# Patient Record
Sex: Female | Born: 1990 | Race: White | Hispanic: No | Marital: Single | Smoking: Former smoker
Health system: Southern US, Community
[De-identification: ages and names within clinical notes are randomized; demographics above are authoritative.]

## PROBLEM LIST (undated history)

## (undated) ENCOUNTER — Inpatient Hospital Stay (HOSPITAL_COMMUNITY): Payer: Self-pay

## (undated) ENCOUNTER — Inpatient Hospital Stay: Payer: Self-pay

## (undated) DIAGNOSIS — K219 Gastro-esophageal reflux disease without esophagitis: Secondary | ICD-10-CM

## (undated) HISTORY — PX: NO PAST SURGERIES: SHX2092

---

## 2012-12-10 ENCOUNTER — Ambulatory Visit: Payer: Self-pay | Admitting: Internal Medicine

## 2012-12-10 LAB — URINALYSIS, COMPLETE
Bilirubin,UR: NEGATIVE
Blood: NEGATIVE
Glucose,UR: NEGATIVE mg/dL (ref 0–75)
Ketone: NEGATIVE
Leukocyte Esterase: NEGATIVE
Nitrite: NEGATIVE
Ph: 5 (ref 4.5–8.0)
Protein: NEGATIVE
Specific Gravity: 1.02 (ref 1.003–1.030)

## 2012-12-10 LAB — PREGNANCY, URINE: Pregnancy Test, Urine: NEGATIVE m[IU]/mL

## 2012-12-11 LAB — URINE CULTURE

## 2013-01-25 ENCOUNTER — Encounter (HOSPITAL_COMMUNITY): Payer: Self-pay | Admitting: Emergency Medicine

## 2013-01-25 ENCOUNTER — Emergency Department (HOSPITAL_COMMUNITY)
Admission: EM | Admit: 2013-01-25 | Discharge: 2013-01-25 | Disposition: A | Discharge status: Home / Self Care | Payer: Self-pay | Attending: Emergency Medicine | Admitting: Emergency Medicine

## 2013-01-25 DIAGNOSIS — L0501 Pilonidal cyst with abscess: Secondary | ICD-10-CM | POA: Insufficient documentation

## 2013-01-25 DIAGNOSIS — L0591 Pilonidal cyst without abscess: Secondary | ICD-10-CM

## 2013-01-25 MED ORDER — SULFAMETHOXAZOLE-TRIMETHOPRIM 800-160 MG PO TABS: 1.0000 | ORAL_TABLET | Freq: Two times a day (BID) | ORAL | Status: DC

## 2013-01-25 MED ORDER — HYDROCODONE-ACETAMINOPHEN 5-325 MG PO TABS
1.0000 | ORAL_TABLET | Freq: Four times a day (QID) | ORAL | Status: DC | PRN
Start: 2013-01-25 — End: 2015-10-30

## 2013-01-25 MED ORDER — OXYCODONE-ACETAMINOPHEN 5-325 MG PO TABS
2.0000 | ORAL_TABLET | Freq: Once | ORAL | Status: AC
  Administered 2013-01-25: 2 via ORAL
  Filled 2013-01-25: qty 2

## 2013-01-25 NOTE — ED Provider Notes (Signed)
CSN: [id number]     Arrival date & time 01/25/13  4098 History  This chart was scribed for non-physician practitioner working Magnus Sinning, [place], with Glynn Octave, MD by Dorothey Baseman, ED Scribe. This patient was seen in room TR11C/TR11C and the patient's care was started at 10:10 PM.   Chief Complaint  Patient presents with  . Abscess   The history is provided by the patient. No language interpreter was used.   HPI Comments: Cassandra Erickson is a 22 y.o. female who presents to the Emergency Department complaining of a painful abscess on the upper buttock for the past few days without drainage. She reports that the pain is worsening.  No treatment prior to arrival.  She denies nausea, vomiting, fever, chills, or any other symptoms. Patient denies any prior history of abscesses or DM.  History reviewed. No pertinent past medical history. History reviewed. No pertinent past surgical history. No family history on file. History  Substance Use Topics  . Smoking status: Never Smoker   . Smokeless tobacco: Not on file  . Alcohol Use: No   OB History   Grav Para Term Preterm Abortions TAB SAB Ect Mult Living                 Review of Systems  A complete 10 system review of systems was obtained and all systems are negative except as noted in the HPI and PMH.   Allergies  Review of patient's allergies indicates no known allergies.  Home Medications  No current outpatient prescriptions on file.  Triage Vitals: BP 111/74  Pulse 91  Temp(Src) 98 F (36.7 C) (Oral)  Resp 14  SpO2 97%  LMP 01/20/2013  Physical Exam  Nursing note and vitals reviewed. Constitutional: She is oriented to person, place, and time. She appears well-developed and well-nourished. No distress.  HENT:  Head: Normocephalic and atraumatic.  Eyes: Conjunctivae are normal.  Neck: Normal range of motion. Neck supple.  Cardiovascular: Normal rate, regular rhythm and normal heart sounds.   Pulmonary/Chest:  Effort normal and breath sounds normal. No respiratory distress.  Musculoskeletal: Normal range of motion.  Neurological: She is alert and oriented to person, place, and time.  Skin: Skin is warm and dry.  3cm erythematous indurated area to the gluteal cleft consistent with a pilonidal cyst that is tender to palpation.  No active drainage at this time.  Psychiatric: She has a normal mood and affect. Her behavior is normal.    ED Course  Procedures (including critical care time)  Medications  oxyCODONE-acetaminophen (PERCOCET/ROXICET) 5-325 MG per tablet 2 tablet (2 tablets Oral Given 01/25/13 2302)    DIAGNOSTIC STUDIES: Oxygen Saturation is 97% on room air, normal by my interpretation.    COORDINATION OF CARE: 10:11PM- Discussed diagnosis of pilonidal cyst. Will refer patient to a general surgeon. Will perform an incision and drainage of the cyst. Discussed treatment plan with patient at bedside and patient verbalized agreement.    INCISION AND DRAINAGE Performed by: Concha Se, PA-S with supervision of Magnus Sinning, PA-C Consent: Verbal consent obtained. Risks and benefits: risks, benefits and alternatives were discussed Type: abscess Body area: sacrum Anesthesia: local infiltration Incision was made with a scalpel. Local anesthetic: lidocaine 2% with epinephrine Anesthetic total: 5 ml Complexity: simple Blunt dissection to break up loculations Drainage: purulent Drainage amount: moderate Patient tolerance: Patient tolerated the procedure well with no immediate complications.     Labs Review Labs Reviewed - No data to display Imaging Review  No results found.  MDM  No diagnosis found. Patient presenting with a pilonidal cyst.  Area incised and drained.  Patient instructed to apply warm compresses to the area.  Patient given referral to General Surgery.  I personally performed the services described in this documentation, which was scribed in my presence.  The recorded information has been reviewed and is accurate.    Pascal Lux Brewster, PA-C 01/31/13 [phone]

## 2013-01-25 NOTE — ED Notes (Signed)
Pt. reports abscess at buttocks for several days with no drainage.

## 2013-01-31 NOTE — ED Provider Notes (Signed)
Medical screening examination/treatment/procedure(s) were performed by non-physician practitioner and as supervising physician I was immediately available for consultation/collaboration.   Glynn Octave, MD 01/31/13 [phone]

## 2014-04-30 ENCOUNTER — Emergency Department: Payer: Self-pay | Admitting: Emergency Medicine

## 2014-04-30 LAB — CBC WITH DIFFERENTIAL/PLATELET
Basophil #: 0 10*3/uL (ref 0.0–0.1)
Basophil %: 0.2 %
Eosinophil #: 0 10*3/uL (ref 0.0–0.7)
Eosinophil %: 0.4 %
HCT: 42.2 % (ref 35.0–47.0)
HGB: 13.9 g/dL (ref 12.0–16.0)
Lymphocyte #: 1.3 10*3/uL (ref 1.0–3.6)
Lymphocyte %: 19.8 %
MCH: 29.2 pg (ref 26.0–34.0)
MCHC: 33 g/dL (ref 32.0–36.0)
MCV: 88 fL (ref 80–100)
Monocyte #: 0.5 x10 3/mm (ref 0.2–0.9)
Monocyte %: 7.7 %
Neutrophil #: 4.9 10*3/uL (ref 1.4–6.5)
Neutrophil %: 71.9 %
Platelet: 208 10*3/uL (ref 150–440)
RBC: 4.77 10*6/uL (ref 3.80–5.20)
RDW: 13 % (ref 11.5–14.5)
WBC: 6.8 10*3/uL (ref 3.6–11.0)

## 2014-04-30 LAB — URINALYSIS, COMPLETE
Bilirubin,UR: NEGATIVE
Blood: NEGATIVE
Glucose,UR: NEGATIVE mg/dL (ref 0–75)
Ketone: NEGATIVE
Nitrite: NEGATIVE
Ph: 5 (ref 4.5–8.0)
Protein: 30
RBC,UR: 17 /HPF (ref 0–5)
Specific Gravity: 1.035 (ref 1.003–1.030)
Squamous Epithelial: 5
WBC UR: 6 /HPF (ref 0–5)

## 2014-04-30 LAB — COMPREHENSIVE METABOLIC PANEL
Albumin: 4 g/dL (ref 3.4–5.0)
Alkaline Phosphatase: 83 U/L
Anion Gap: 7 (ref 7–16)
BUN: 10 mg/dL (ref 7–18)
Bilirubin,Total: 0.3 mg/dL (ref 0.2–1.0)
Calcium, Total: 8.6 mg/dL (ref 8.5–10.1)
Chloride: 108 mmol/L — ABNORMAL HIGH (ref 98–107)
Co2: 24 mmol/L (ref 21–32)
Creatinine: 0.77 mg/dL (ref 0.60–1.30)
EGFR (African American): >60
EGFR (Non-African Amer.): >60
Glucose: 92 mg/dL (ref 65–99)
Osmolality: 276 (ref 275–301)
Potassium: 3.4 mmol/L — ABNORMAL LOW (ref 3.5–5.1)
SGOT(AST): 11 U/L — ABNORMAL LOW (ref 15–37)
SGPT (ALT): 20 U/L
Sodium: 139 mmol/L (ref 136–145)
Total Protein: 7.6 g/dL (ref 6.4–8.2)

## 2014-04-30 LAB — LIPASE, BLOOD: Lipase: 71 U/L — ABNORMAL LOW (ref 73–393)

## 2015-10-30 ENCOUNTER — Emergency Department
Admission: EM | Admit: 2015-10-30 | Discharge: 2015-10-30 | Disposition: A | Discharge status: Home / Self Care | Payer: Managed Care, Other (non HMO) | Attending: Student | Admitting: Student

## 2015-10-30 ENCOUNTER — Encounter: Payer: Self-pay | Admitting: Emergency Medicine

## 2015-10-30 ENCOUNTER — Emergency Department: Payer: Managed Care, Other (non HMO)

## 2015-10-30 DIAGNOSIS — Z3A01 Less than 8 weeks gestation of pregnancy: Secondary | ICD-10-CM | POA: Diagnosis not present

## 2015-10-30 DIAGNOSIS — R1010 Upper abdominal pain, unspecified: Secondary | ICD-10-CM | POA: Diagnosis present

## 2015-10-30 DIAGNOSIS — R109 Unspecified abdominal pain: Secondary | ICD-10-CM

## 2015-10-30 DIAGNOSIS — O26899 Other specified pregnancy related conditions, unspecified trimester: Secondary | ICD-10-CM

## 2015-10-30 DIAGNOSIS — O200 Threatened abortion: Secondary | ICD-10-CM | POA: Diagnosis not present

## 2015-10-30 LAB — URINALYSIS COMPLETE WITH MICROSCOPIC (ARMC ONLY)
Bilirubin Urine: NEGATIVE
Glucose, UA: NEGATIVE mg/dL
Hgb urine dipstick: NEGATIVE
Ketones, ur: NEGATIVE mg/dL
Leukocytes, UA: NEGATIVE
Nitrite: NEGATIVE
Protein, ur: NEGATIVE mg/dL
Specific Gravity, Urine: 1.003 — ABNORMAL LOW (ref 1.005–1.030)
pH: 7 (ref 5.0–8.0)

## 2015-10-30 LAB — COMPREHENSIVE METABOLIC PANEL
ALT: 20 U/L (ref 14–54)
AST: 18 U/L (ref 15–41)
Albumin: 4.1 g/dL (ref 3.5–5.0)
Alkaline Phosphatase: 61 U/L (ref 38–126)
Anion gap: 8 (ref 5–15)
BUN: 14 mg/dL (ref 6–20)
CO2: 23 mmol/L (ref 22–32)
Calcium: 9.1 mg/dL (ref 8.9–10.3)
Chloride: 107 mmol/L (ref 101–111)
Creatinine, Ser: 0.52 mg/dL (ref 0.44–1.00)
GFR calc Af Amer: >60 mL/min (ref >60)
GFR calc non Af Amer: >60 mL/min (ref >60)
Glucose, Bld: 96 mg/dL (ref 65–99)
Potassium: 3.8 mmol/L (ref 3.5–5.1)
Sodium: 138 mmol/L (ref 135–145)
Total Bilirubin: 0.4 mg/dL (ref 0.3–1.2)
Total Protein: 7.1 g/dL (ref 6.5–8.1)

## 2015-10-30 LAB — CBC
HCT: 37.7 % (ref 35.0–47.0)
Hemoglobin: 12.6 g/dL (ref 12.0–16.0)
MCH: 29.6 pg (ref 26.0–34.0)
MCHC: 33.5 g/dL (ref 32.0–36.0)
MCV: 88.4 fL (ref 80.0–100.0)
Platelets: 233 10*3/uL (ref 150–440)
RBC: 4.27 MIL/uL (ref 3.80–5.20)
RDW: 13.4 % (ref 11.5–14.5)
WBC: 8.4 10*3/uL (ref 3.6–11.0)

## 2015-10-30 LAB — PREGNANCY, URINE: Preg Test, Ur: POSITIVE — AB

## 2015-10-30 LAB — LIPASE, BLOOD: Lipase: 20 U/L (ref 11–51)

## 2015-10-30 LAB — HCG, QUANTITATIVE, PREGNANCY: hCG, Beta Chain, Quant, S: 10151 m[IU]/mL — ABNORMAL HIGH (ref <5)

## 2015-10-30 NOTE — ED Provider Notes (Signed)
[facility] Emergency Department Provider Note   ____________________________________________  Time seen: Approximately 5:26 PM  I have reviewed the triage vital signs and the nursing notes.   HISTORY  Chief Complaint Abdominal Pain    HPI Cassandra Erickson is a 25 y.o. female with no chronic medical problems, G1 P0 at approximately 5 weeks estimated gestational age by last menstrual period on 09/24/15 who presents for evaluation of one week of intermittent upper abdominal pain, usually gradual onset, happens mostly in the mornings and resolves spontaneously, not associated with eating, currently mild. No nausea, vomiting, diarrhea, fevers or chills. No chest pain or difficulty breathing. No abnormal vaginal bleeding or vaginal discharge.   History reviewed. No pertinent past medical history.  There are no active problems to display for this patient.   History reviewed. No pertinent past surgical history.  No current outpatient prescriptions on file.  Allergies Review of patient's allergies indicates no known allergies.  No family history on file.  Social History Social History  Substance Use Topics  . Smoking status: Never Smoker   . Smokeless tobacco: None  . Alcohol Use: No    Review of Systems Constitutional: No fever/chills Eyes: No visual changes. ENT: No sore throat. Cardiovascular: Denies chest pain. Respiratory: Denies shortness of breath. Gastrointestinal: + abdominal pain.  No nausea, no vomiting.  No diarrhea.  No constipation. Genitourinary: Negative for dysuria. Musculoskeletal: Negative for back pain. Skin: Negative for rash. Neurological: Negative for headaches, focal weakness or numbness.  10-point ROS otherwise negative.  ____________________________________________   PHYSICAL EXAM:  VITAL SIGNS: ED Triage Vitals  Enc Vitals Group     BP 10/30/15 1532 132/74 mmHg     Pulse Rate 10/30/15 1532 88     Resp  10/30/15 1532 18     Temp 10/30/15 1532 98.1 F (36.7 C)     Temp Source 10/30/15 1532 Oral     SpO2 10/30/15 1532 100 %     Weight 10/30/15 1532 159 lb (72.122 kg)     Height 10/30/15 1532 5' (1.524 m)     Head Cir --      Peak Flow --      Pain Score 10/30/15 1536 8     Pain Loc --      Pain Edu? --      Excl. in GC? --     Constitutional: Alert and oriented. Well appearing and in no acute distress. Eyes: Conjunctivae are normal. PERRL. EOMI. Head: Atraumatic. Nose: No congestion/rhinnorhea. Mouth/Throat: Mucous membranes are moist.  Oropharynx non-erythematous. Neck: No stridor. Supple without meningismus. Cardiovascular: Normal rate, regular rhythm. Grossly normal heart sounds.  Good peripheral circulation. Respiratory: Normal respiratory effort.  No retractions. Lungs CTAB. Gastrointestinal: Soft and nontender. No distention. No CVA tenderness. Genitourinary: deferred Musculoskeletal: No lower extremity tenderness nor edema.  No joint effusions. Neurologic:  Normal speech and language. No gross focal neurologic deficits are appreciated. No gait instability. Skin:  Skin is warm, dry and intact. No rash noted. Psychiatric: Mood and affect are normal. Speech and behavior are normal.  ____________________________________________   LABS (all labs ordered are listed, but only abnormal results are displayed)  Labs Reviewed  URINALYSIS COMPLETEWITH MICROSCOPIC ([facility] ONLY) - Abnormal; Notable for the following:    Color, Urine COLORLESS (*)    APPearance CLEAR (*)    Specific Gravity, Urine 1.003 (*)    Bacteria, UA RARE (*)    Squamous Epithelial / LPF 0-5 (*)    All  other components within normal limits  HCG, QUANTITATIVE, PREGNANCY - Abnormal; Notable for the following:    hCG, Beta Chain, Quant, S 10151 (*)    All other components within normal limits  PREGNANCY, URINE - Abnormal; Notable for the following:    Preg Test, Ur POSITIVE (*)    All other components within  normal limits  COMPREHENSIVE METABOLIC PANEL  CBC  LIPASE, BLOOD   ____________________________________________  EKG  none ____________________________________________  RADIOLOGY  Transvaginal ultrasound IMPRESSION: Single live intrauterine pregnancy noted, with a crown-rump length of 3 mm, corresponding to a gestational age of [redacted] weeks 6 days. This matches the gestational age of [redacted] weeks 1 day by LMP, reflecting an estimated date of delivery of June 30, 2016.  ____________________________________________   PROCEDURES  Procedure(s) performed: None  Critical Care performed: No  ____________________________________________   INITIAL IMPRESSION / ASSESSMENT AND PLAN / ED COURSE  Pertinent labs & imaging results that were available during my care of the patient were reviewed by me and considered in my medical decision making (see chart for details).  Cassandra Erickson is a 25 y.o. female with no chronic medical problems, G1 P0 at approximately 5 weeks estimated gestational age by last menstrual period on 09/24/15 who presents for evaluation of one week of intermittent upper abdominal pain which is usually self-limiting. Currently she does not have any pain. On exam, she is very well-appearing and in no acute distress, vital signs stable, she is afebrile. She has a benign abdominal examination. Low suspicion for any acute life that he injured abdominal process in this patient however given pregnancy, we'll obtain trans vaginal ultrasound to rule out ectopic.  ----------------------------------------- 7:14 PM on 10/30/2015 ----------------------------------------- I reviewed the patient's labs. CBC, CMP, lipase unremarkable. Urinalysis is not consistent with infection. HCG is appropriately elevated, transvaginal ultrasound shows a single live intrauterine pregnancy. Patient  has had no pain since arrival to the emergency department. I discussed that the cause of her pain is not  clear at this time, we discussed possible threatened miscarriage, need for close OB/GYN follow-up, recurrent precautions and she is comfortable with the discharge plan. DC home.  ____________________________________________   FINAL CLINICAL IMPRESSION(S) / ED DIAGNOSES  Final diagnoses:  Abdominal pain in pregnancy  Threatened miscarriage in early pregnancy      NEW MEDICATIONS STARTED DURING THIS VISIT:  New Prescriptions   No medications on file     Note:  This document was prepared using Dragon voice recognition software and may include unintentional dictation errors.    Gayla Doss, MD 10/30/15 [phone]

## 2015-10-30 NOTE — ED Notes (Signed)
Pt to US at this time.

## 2015-10-30 NOTE — ED Notes (Signed)
Family in the room - I informed them that the pt is in US and will return in approx 30 minutes

## 2015-10-30 NOTE — ED Notes (Signed)
Pt presents with abd pain, states she is five weeks pregnant. Denies any vaginal bleeding at this time.

## 2015-12-05 LAB — OB RESULTS CONSOLE RUBELLA ANTIBODY, IGM: Rubella: IMMUNE

## 2015-12-05 LAB — OB RESULTS CONSOLE HEPATITIS B SURFACE ANTIGEN: Hepatitis B Surface Ag: NEGATIVE

## 2015-12-05 LAB — OB RESULTS CONSOLE VARICELLA ZOSTER ANTIBODY, IGG: Varicella: IMMUNE

## 2015-12-05 LAB — OB RESULTS CONSOLE RPR: RPR: NONREACTIVE

## 2015-12-05 LAB — OB RESULTS CONSOLE ANTIBODY SCREEN: Antibody Screen: NEGATIVE

## 2015-12-05 LAB — OB RESULTS CONSOLE HIV ANTIBODY (ROUTINE TESTING): HIV: NONREACTIVE

## 2015-12-15 ENCOUNTER — Emergency Department: Payer: Managed Care, Other (non HMO)

## 2015-12-15 ENCOUNTER — Emergency Department
Admission: EM | Admit: 2015-12-15 | Discharge: 2015-12-15 | Disposition: A | Discharge status: Home / Self Care | Payer: Managed Care, Other (non HMO) | Attending: Emergency Medicine | Admitting: Emergency Medicine

## 2015-12-15 ENCOUNTER — Encounter: Payer: Self-pay | Admitting: Emergency Medicine

## 2015-12-15 DIAGNOSIS — Z79899 Other long term (current) drug therapy: Secondary | ICD-10-CM | POA: Insufficient documentation

## 2015-12-15 DIAGNOSIS — R102 Pelvic and perineal pain: Secondary | ICD-10-CM | POA: Diagnosis not present

## 2015-12-15 DIAGNOSIS — O209 Hemorrhage in early pregnancy, unspecified: Secondary | ICD-10-CM | POA: Diagnosis not present

## 2015-12-15 DIAGNOSIS — Z3A12 12 weeks gestation of pregnancy: Secondary | ICD-10-CM | POA: Insufficient documentation

## 2015-12-15 DIAGNOSIS — N939 Abnormal uterine and vaginal bleeding, unspecified: Secondary | ICD-10-CM

## 2015-12-15 LAB — CBC
HCT: 36.8 % (ref 35.0–47.0)
Hemoglobin: 12.9 g/dL (ref 12.0–16.0)
MCH: 30.8 pg (ref 26.0–34.0)
MCHC: 35 g/dL (ref 32.0–36.0)
MCV: 88.1 fL (ref 80.0–100.0)
Platelets: 213 10*3/uL (ref 150–440)
RBC: 4.18 MIL/uL (ref 3.80–5.20)
RDW: 13.1 % (ref 11.5–14.5)
WBC: 7.3 10*3/uL (ref 3.6–11.0)

## 2015-12-15 LAB — HCG, QUANTITATIVE, PREGNANCY: hCG, Beta Chain, Quant, S: 82572 m[IU]/mL — ABNORMAL HIGH (ref <5)

## 2015-12-15 LAB — ABO/RH: ABO/RH(D): A POS

## 2015-12-15 NOTE — Discharge Instructions (Signed)
Please follow up closely with obstetrics and gynecology or your primary doctor.  Return to the emergency room if your bleeding worsens, you become weak and dizzy or lightheaded, you have an episode of passing out, develop severe bleeding such as more than 1 soaked pad per hour for more than 3 straight hours, develop abdominal or pelvic pain, fevers chills or other new concerns arise.   

## 2015-12-15 NOTE — ED Triage Notes (Signed)
Reports [redacted] wks pregnant.  Today having heavy bleeding.  Denies cramping

## 2015-12-15 NOTE — ED Notes (Signed)
Patient transported to Ultrasound 

## 2015-12-15 NOTE — ED Provider Notes (Signed)
[facility] Emergency Department Provider Note   ____________________________________________   First MD Initiated Contact with Patient 12/15/15 1104     (approximate)  I have reviewed the triage vital signs and the nursing notes.   HISTORY  Chief Complaint Vaginal Bleeding    HPI Cassandra Erickson is a 25 y.o. female reports no significant medical history. This is her first pregnancy and she is about [redacted] weeks pregnant.  She reports that this morning she got up, and when she went to use the bathroom noticing that there was blood coming from the vagina. She reports it was about 1 pad of blood, and it seems to have stopped now. Not associated with any pain nausea or vomiting. No other discharge.  Currently she reports she is resting comfortably and not having any further bleeding.   History reviewed. No pertinent past medical history.  There are no active problems to display for this patient.   History reviewed. No pertinent surgical history.  Prior to Admission medications   Medication Sig Start Date End Date Taking? Authorizing Provider  DOCOSAHEXAENOIC ACID PO Take 1 capsule by mouth daily.   Yes Historical Provider, MD    Allergies Review of patient's allergies indicates no known allergies.  No family history on file.  Social History Social History  Substance Use Topics  . Smoking status: Never Smoker  . Smokeless tobacco: Never Used  . Alcohol use No    Review of Systems Constitutional: No fever/chills Eyes: No visual changes. ENT: No sore throat. Cardiovascular: Denies chest pain. Respiratory: Denies shortness of breath. Gastrointestinal: No abdominal pain.  No nausea, no vomiting. Genitourinary: Negative for dysuria. Musculoskeletal: Negative for back pain. Skin: Negative for rash. Neurological: Negative for headaches, focal weakness or numbness.  10-point ROS otherwise  negative.  ____________________________________________   PHYSICAL EXAM:  VITAL SIGNS: ED Triage Vitals [12/15/15 0926]  Enc Vitals Group     BP (!) 117/56     Pulse Rate 78     Resp 18     Temp 98.1 F (36.7 C)     Temp Source Oral     SpO2 100 %     Weight 160 lb (72.6 kg)     Height 5' (1.524 m)     Head Circumference      Peak Flow      Pain Score      Pain Loc      Pain Edu?      Excl. in GC?     Constitutional: Alert and oriented. Well appearing and in no acute distress. Eyes: Conjunctivae are normal. PERRL. EOMI. Head: Atraumatic. Nose: No congestion/rhinnorhea. Mouth/Throat: Mucous membranes are moist.  Oropharynx non-erythematous. Neck: No stridor.   Cardiovascular: Normal rate, regular rhythm. Grossly normal heart sounds.  Good peripheral circulation. Respiratory: Normal respiratory effort.  No retractions. Lungs CTAB. Gastrointestinal: Soft and nontender. No distention.Likely gravid feeling uterus just above the pubic symphysis. No abdominal bruits. No CVA tenderness. Musculoskeletal: No lower extremity tenderness nor edema.  No joint effusions. Neurologic:  Normal speech and language. No gross focal neurologic deficits are appreciated. No gait instability. Skin:  Skin is warm, dry and intact. No rash noted. Psychiatric: Mood and affect are normal. Speech and behavior are normal.  ____________________________________________   LABS (all labs ordered are listed, but only abnormal results are displayed)  Labs Reviewed  HCG, QUANTITATIVE, PREGNANCY - Abnormal; Notable for the following:       Result Value   hCG, Beta  Chain, Sharene Butters, S 16,109 (*)    All other components within normal limits  CBC  ABO/RH   ____________________________________________  EKG   ____________________________________________  RADIOLOGY  US Ob Comp Less 14 Wks  Result Date: 12/15/2015 CLINICAL DATA:  Pelvic pain and vaginal bleeding in a pregnant patient. EXAM: OBSTETRIC  <14 WK ULTRASOUND TECHNIQUE: Transabdominal ultrasound was performed for evaluation of the gestation as well as the maternal uterus and adnexal regions. COMPARISON:  October 30, 2015 FINDINGS: A single live IUP is identified with a fetal heart rate of 168 beats per minute. No yolk sac is identified. The crown-rump length is 6.18 cm correlating with a gestational age of [redacted] weeks and 4 days and an estimated delivery date of June 24, 2016. No subchorionic hemorrhage. The ovaries are normal in appearance. No free fluid in the pelvis. IMPRESSION: Single live IUP with no cause for bleeding or pain identified. Electronically Signed   By: Gerome Sam III M.D   On: 12/15/2015 12:23   ____________________________________________   PROCEDURES  Procedure(s) performed: None  Procedures  Critical Care performed: No  ____________________________________________   INITIAL IMPRESSION / ASSESSMENT AND PLAN / ED COURSE  Pertinent labs & imaging results that were available during my care of the patient were reviewed by me and considered in my medical decision making (see chart for details).  Patient presents first trimester with painless vaginal bleeding. No systemic symptoms, and no abdominal pain. Reassuring clinical examination. We'll proceed with ultrasound to evaluate for potential etiology.  Clinical Course    Ultrasound reassuring. Patient not having any further discomfort or bleeding. She will follow-up with her doctor at [facility], and she is planning to go there to schedule a follow-up appointment today. Return precautions and treatment recommendations and follow-up discussed with the patient who is agreeable with the plan.  ____________________________________________   FINAL CLINICAL IMPRESSION(S) / ED DIAGNOSES  Final diagnoses:  Vaginal bleeding in pregnancy, first trimester      NEW MEDICATIONS STARTED DURING THIS VISIT:  New Prescriptions   No medications on file      Note:  This document was prepared using Dragon voice recognition software and may include unintentional dictation errors.     Sharyn Creamer, MD 12/15/15 1323

## 2015-12-17 ENCOUNTER — Other Ambulatory Visit: Payer: Self-pay | Admitting: Obstetrics and Gynecology

## 2015-12-17 DIAGNOSIS — Z369 Encounter for antenatal screening, unspecified: Secondary | ICD-10-CM

## 2015-12-26 ENCOUNTER — Emergency Department
Admission: EM | Admit: 2015-12-26 | Discharge: 2015-12-26 | Disposition: A | Discharge status: Home / Self Care | Payer: Managed Care, Other (non HMO) | Attending: Emergency Medicine | Admitting: Emergency Medicine

## 2015-12-26 ENCOUNTER — Encounter: Payer: Self-pay | Admitting: Emergency Medicine

## 2015-12-26 ENCOUNTER — Emergency Department: Payer: Managed Care, Other (non HMO)

## 2015-12-26 DIAGNOSIS — O9989 Other specified diseases and conditions complicating pregnancy, childbirth and the puerperium: Secondary | ICD-10-CM | POA: Diagnosis not present

## 2015-12-26 DIAGNOSIS — Z3A13 13 weeks gestation of pregnancy: Secondary | ICD-10-CM | POA: Insufficient documentation

## 2015-12-26 DIAGNOSIS — Z3490 Encounter for supervision of normal pregnancy, unspecified, unspecified trimester: Secondary | ICD-10-CM

## 2015-12-26 DIAGNOSIS — G43909 Migraine, unspecified, not intractable, without status migrainosus: Secondary | ICD-10-CM | POA: Diagnosis not present

## 2015-12-26 LAB — URINALYSIS COMPLETE WITH MICROSCOPIC (ARMC ONLY)
Bilirubin Urine: NEGATIVE
Glucose, UA: NEGATIVE mg/dL
Hgb urine dipstick: NEGATIVE
Leukocytes, UA: NEGATIVE
Nitrite: NEGATIVE
Protein, ur: NEGATIVE mg/dL
Specific Gravity, Urine: 1.015 (ref 1.005–1.030)
pH: 6 (ref 5.0–8.0)

## 2015-12-26 LAB — BASIC METABOLIC PANEL
Anion gap: 9 (ref 5–15)
BUN: 5 mg/dL — ABNORMAL LOW (ref 6–20)
CO2: 21 mmol/L — ABNORMAL LOW (ref 22–32)
Calcium: 9.1 mg/dL (ref 8.9–10.3)
Chloride: 106 mmol/L (ref 101–111)
Creatinine, Ser: 0.42 mg/dL — ABNORMAL LOW (ref 0.44–1.00)
GFR calc Af Amer: >60 mL/min (ref >60)
GFR calc non Af Amer: >60 mL/min (ref >60)
Glucose, Bld: 72 mg/dL (ref 65–99)
Potassium: 4.5 mmol/L (ref 3.5–5.1)
Sodium: 136 mmol/L (ref 135–145)

## 2015-12-26 LAB — CBC WITH DIFFERENTIAL/PLATELET
Basophils Absolute: 0 10*3/uL (ref 0–0.1)
Basophils Relative: 0 %
Eosinophils Absolute: 0 10*3/uL (ref 0–0.7)
Eosinophils Relative: 0 %
HCT: 38.4 % (ref 35.0–47.0)
Hemoglobin: 13.2 g/dL (ref 12.0–16.0)
Lymphocytes Relative: 17 %
Lymphs Abs: 1.6 10*3/uL (ref 1.0–3.6)
MCH: 30.1 pg (ref 26.0–34.0)
MCHC: 34.4 g/dL (ref 32.0–36.0)
MCV: 87.5 fL (ref 80.0–100.0)
Monocytes Absolute: 0.5 10*3/uL (ref 0.2–0.9)
Monocytes Relative: 5 %
Neutro Abs: 7.4 10*3/uL — ABNORMAL HIGH (ref 1.4–6.5)
Neutrophils Relative %: 78 %
Platelets: 202 10*3/uL (ref 150–440)
RBC: 4.39 MIL/uL (ref 3.80–5.20)
RDW: 13.1 % (ref 11.5–14.5)
WBC: 9.6 10*3/uL (ref 3.6–11.0)

## 2015-12-26 LAB — HCG, QUANTITATIVE, PREGNANCY: hCG, Beta Chain, Quant, S: 49361 m[IU]/mL — ABNORMAL HIGH (ref <5)

## 2015-12-26 MED ORDER — HYDROCODONE-ACETAMINOPHEN 5-325 MG PO TABS: 1.0000 | ORAL_TABLET | ORAL | 0 refills | Status: DC | PRN

## 2015-12-26 MED ORDER — SODIUM CHLORIDE 0.9 % IV BOLUS (SEPSIS)
1000.0000 mL | Freq: Once | INTRAVENOUS | Status: AC
  Administered 2015-12-26: 1000 mL via INTRAVENOUS

## 2015-12-26 NOTE — ED Triage Notes (Signed)
Ok per [employer]

## 2015-12-26 NOTE — Discharge Instructions (Signed)
Follow-up with her OB/GYN doctor on Monday if any continued problems return to the emergency room if any severe worsening of your symptoms. Today your CT was negative. Increase fluids. Norco as needed headache sparingly. Be aware this medication could cause drowsiness and increase your risk for falling. Also make your OB/GYN doctor aware that you  have taken this medication for your headache.

## 2015-12-26 NOTE — ED Provider Notes (Signed)
[facility] Emergency Department Provider Note  ____________________________________________   First MD Initiated Contact with Patient 12/26/15 1428     (approximate)  I have reviewed the triage vital signs and the nursing notes.   HISTORY  Chief Complaint Migraine   HPI Cassandra Erickson is a 25 y.o. female is here with complaint of headache. Patient states she was smiling with headache and bilateral temporal area. She went to [facility] acute-care to tell them about her headache and requesting treatment. Patient told provider there that this was "the worst headache of her life". Patientis approximately 13-[redacted] weeks pregnant. Patient does have a history of migraines in the past has taken Excedrin but was told not to take this medication while being pregnant. Patient is currently not on any preventative headache medication. Patient did not call her OB/GYN. Patient did not take any Tylenol because she did not want take any medications while being pregnant. Patient complains of nausea and states that she vomited once this morning. She denies any visual changes or with her headache. When pointing to her source of pain she points at bilateral temporal areas. Patient denies any photophobia. Currently she rates her pain as a 10 over 10.      History reviewed. No pertinent past medical history.  There are no active problems to display for this patient.   History reviewed. No pertinent surgical history.  Prior to Admission medications   Medication Sig Start Date End Date Taking? Authorizing Provider  DOCOSAHEXAENOIC ACID PO Take 1 capsule by mouth daily.    Historical Provider, MD  HYDROcodone-acetaminophen (NORCO/VICODIN) 5-325 MG tablet Take 1 tablet by mouth every 4 (four) hours as needed for moderate pain. 12/26/15   Tommi Rumps, PA-C    Allergies Review of patient's allergies indicates no known allergies.  History reviewed. No pertinent family  history.  Social History Social History  Substance Use Topics  . Smoking status: Never Smoker  . Smokeless tobacco: Never Used  . Alcohol use No    Review of Systems Constitutional: No fever/chills Eyes: No visual changes. ENT: No sore throat. Cardiovascular: Denies chest pain. Respiratory: Denies shortness of breath. Gastrointestinal: No abdominal pain.  Positive nausea, positive vomiting.  No diarrhea.  No constipation. Genitourinary: Negative for dysuria. Musculoskeletal: Negative for back pain. Skin: Negative for rash. Neurological: Positive for headaches, negative for focal weakness or numbness.  10-point ROS otherwise negative.  ____________________________________________   PHYSICAL EXAM:  VITAL SIGNS: ED Triage Vitals  Enc Vitals Group     BP 12/26/15 1314 (!) 115/51     Pulse Rate 12/26/15 1314 76     Resp 12/26/15 1314 18     Temp 12/26/15 1314 98.2 F (36.8 C)     Temp Source 12/26/15 1314 Oral     SpO2 12/26/15 1314 100 %     Weight 12/26/15 1308 163 lb (73.9 kg)     Height 12/26/15 1308 5' (1.524 m)     Head Circumference --      Peak Flow --      Pain Score 12/26/15 1308 10     Pain Loc --      Pain Edu? --      Excl. in GC? --     Constitutional: Alert and oriented. Well appearing and in no acute distress. Eyes: Conjunctivae are normal. PERRL. EOMI. Negative for nystagmus. Negative for photophobia. Head: Atraumatic. Nose: No congestion/rhinnorhea. Mouth/Throat: Mucous membranes are moist.  Oropharynx non-erythematous. Neck: No stridor.  No cervical tenderness on palpation posteriorly. Range of motion is within normal limits without restriction or pain. No nuchal rigidity is noted. Hematological/Lymphatic/Immunilogical: No cervical lymphadenopathy. Cardiovascular: Normal rate, regular rhythm. Grossly normal heart sounds.  Good peripheral circulation. Respiratory: Normal respiratory effort.  No retractions. Lungs CTAB. Musculoskeletal: Moves  upper and lower extremities without any difficulty. Normal gait was noted. Neurologic:  Normal speech and language. No gross focal neurologic deficits are appreciated. No gait instability. Cranial nerves II through XII grossly intact. Reflexes 2+ are equal bilaterally. Skin:  Skin is warm, dry and intact. No rash noted. Psychiatric: Mood and affect are normal. Speech and behavior are normal.  ____________________________________________   LABS (all labs ordered are listed, but only abnormal results are displayed)  Labs Reviewed  URINALYSIS COMPLETEWITH MICROSCOPIC ([facility] ONLY) - Abnormal; Notable for the following:       Result Value   Color, Urine YELLOW (*)    APPearance CLEAR (*)    Ketones, ur 2+ (*)    Bacteria, UA RARE (*)    Squamous Epithelial / LPF 0-5 (*)    All other components within normal limits  CBC WITH DIFFERENTIAL/PLATELET - Abnormal; Notable for the following:    Neutro Abs 7.4 (*)    All other components within normal limits  BASIC METABOLIC PANEL - Abnormal; Notable for the following:    CO2 21 (*)    BUN 5 (*)    Creatinine, Ser 0.42 (*)    All other components within normal limits  HCG, QUANTITATIVE, PREGNANCY - Abnormal; Notable for the following:    hCG, Beta Chain, Quant, S 49,361 (*)    All other components within normal limits     RADIOLOGY  CT scan per radiologist was negative for acute findings. ____________________________________________   PROCEDURES  Procedure(s) performed: None  Procedures  Critical Care performed: No  ____________________________________________   INITIAL IMPRESSION / ASSESSMENT AND PLAN / ED COURSE  Pertinent labs & imaging results that were available during my care of the patient were reviewed by me and considered in my medical decision making (see chart for details).    Clinical Course  Patient was given IV fluids due to her decreased by mouth intake because of nausea and 2+ ketones in her urine. At the  time discharge patient was sitting up on the stretcher eating french fries and fried chicken. Patient denied any continued nausea, vomiting or headache. Patient was given a prescription for Vicodin 5 tablets 1 every 4 hours as needed for severe headache. Patient is to follow-up with her OB/GYN doctor next week and to also be selective in taking narcotics. Patient is aware that these medications will also cross the placenta. She understands and acknowledges this risk.   ____________________________________________   FINAL CLINICAL IMPRESSION(S) / ED DIAGNOSES  Final diagnoses:  Migraine without status migrainosus, not intractable, unspecified migraine type  Pregnancy      NEW MEDICATIONS STARTED DURING THIS VISIT:  New Prescriptions   HYDROCODONE-ACETAMINOPHEN (NORCO/VICODIN) 5-325 MG TABLET    Take 1 tablet by mouth every 4 (four) hours as needed for moderate pain.     Note:  This document was prepared using Dragon voice recognition software and may include unintentional dictation errors.    Tommi RumpsRhonda L Nivia Gervase, PA-C 12/26/15 1710    Sharyn CreamerMark Quale, MD 12/28/15 2109

## 2015-12-26 NOTE — ED Triage Notes (Signed)
Pt c/o headache that started this AM. Laughing and joking with triage RN. Has had NV r/t pregnancy but not wanting to be seen for this only headache. Did not take tylenol because ;'i didn't want to take any medicine being pregnant". Asked if she wanted the doctor to giver her something and reports if doctor gives her it then is ok.

## 2015-12-26 NOTE — ED Triage Notes (Signed)
Patient presents to the ED with a headache since waking this morning.  Patient denies issues with vision, dizziness, and weakness.  Patient reports being 13-[redacted] weeks pregnant.  [facility] and was sent to ED due to their limited ability to evaluate patient's headache due to her pregnancy.  Patient is in no obvious distress at this time.  Patient texting during triage.

## 2015-12-29 ENCOUNTER — Ambulatory Visit (HOSPITAL_BASED_OUTPATIENT_CLINIC_OR_DEPARTMENT_OTHER)
Admission: RE | Admit: 2015-12-29 | Discharge: 2015-12-29 | Disposition: A | Discharge status: Home / Self Care | Payer: Managed Care, Other (non HMO) | Source: Ambulatory Visit | Attending: Maternal & Fetal Medicine | Admitting: Maternal & Fetal Medicine

## 2015-12-29 ENCOUNTER — Ambulatory Visit
Admission: RE | Admit: 2015-12-29 | Discharge: 2015-12-29 | Disposition: A | Discharge status: Home / Self Care | Payer: Managed Care, Other (non HMO) | Source: Ambulatory Visit | Attending: Obstetrics and Gynecology | Admitting: Obstetrics and Gynecology

## 2015-12-29 VITALS — BP 114/63 | HR 83 | Temp 97.6°F | Wt 165.0 lb

## 2015-12-29 DIAGNOSIS — Z3A14 14 weeks gestation of pregnancy: Secondary | ICD-10-CM | POA: Diagnosis not present

## 2015-12-29 DIAGNOSIS — Z3482 Encounter for supervision of other normal pregnancy, second trimester: Secondary | ICD-10-CM

## 2015-12-29 DIAGNOSIS — Z3402 Encounter for supervision of normal first pregnancy, second trimester: Secondary | ICD-10-CM | POA: Insufficient documentation

## 2015-12-29 DIAGNOSIS — Z36 Encounter for antenatal screening of mother: Secondary | ICD-10-CM | POA: Diagnosis not present

## 2015-12-29 DIAGNOSIS — Z369 Encounter for antenatal screening, unspecified: Secondary | ICD-10-CM

## 2015-12-29 NOTE — Progress Notes (Signed)
I agree with the assessment and plan as outlined in CGC Wells's note.  

## 2015-12-29 NOTE — Progress Notes (Signed)
Referring physician:  [facility] Length of Consultation: 40 minutes   Cassandra Erickson  [facility] counseling to review prenatal screening and testing options.  This note summarizes the information we discussed.    We offered the following routine screening tests for this pregnancy:  First trimester screening, which includes nuchal translucency ultrasound screen and first trimester maternal serum marker screening.  The nuchal translucency has approximately an 80% detection rate for Down syndrome and can be positive for other chromosome abnormalities as well as congenital heart defects.  When combined with a maternal serum marker screening, the detection rate is up to 90% for Down syndrome and up to 97% for trisomy 18.     Maternal serum marker screening, a blood test that measures pregnancy proteins, can provide risk assessments for Down syndrome, trisomy 18, and open neural tube defects (spina bifida, anencephaly). Because it does not directly examine the fetus, it cannot positively diagnose or rule out these problems.  Targeted ultrasound uses high frequency sound waves to create an image of the developing fetus.  An ultrasound is often recommended as a routine means of evaluating the pregnancy.  It is also used to screen for fetal anatomy problems (for example, a heart defect) that might be suggestive of a chromosomal or other abnormality.   Should these screening tests indicate an increased concern, then the following additional testing options would be offered:  The chorionic villus sampling procedure is available for first trimester chromosome analysis.  This involves the withdrawal of a small amount of chorionic villi (tissue from the developing placenta).  Risk of pregnancy loss is estimated to be approximately 1 in 200 to 1 in 100 (0.5 to 1%).  There is approximately a 1% (1 in 100) chance that the CVS chromosome results will be unclear.   Chorionic villi cannot be tested for neural tube defects.     Amniocentesis involves the removal of a small amount of amniotic fluid from the sac surrounding the fetus with the use of a thin needle inserted through the maternal abdomen and uterus.  Ultrasound guidance is used throughout the procedure.  Fetal cells from amniotic fluid are directly evaluated and > 99.5% of chromosome problems and > 98% of open neural tube defects can be detected. This procedure is generally performed after the 15th week of pregnancy.  The main risks to this procedure include complications leading to miscarriage in less than 1 in 200 cases (0.5%).  As another option for information if the pregnancy is suspected to be an an increased chance for certain chromosome conditions, we also reviewed the availability of cell free fetal DNA testing from maternal blood to determine whether or not the baby may have either Down syndrome, trisomy 4013, or trisomy 218.  This test utilizes a maternal blood sample and DNA sequencing technology to isolate circulating cell free fetal DNA from maternal plasma.  The fetal DNA can then be analyzed for DNA sequences that are derived from the three most common chromosomes involved in aneuploidy, chromosomes 13, 18, and 21.  If the overall amount of DNA is greater than the expected level for any of these chromosomes, aneuploidy is suspected.  While we do not consider it a replacement for invasive testing and karyotype analysis, a negative result from this testing would be reassuring, though not a guarantee of a normal chromosome complement for the baby.  An abnormal result is certainly suggestive of an abnormal chromosome complement, though  we would still recommend CVS or amniocentesis to confirm any findings from this testing.   Cystic Fibrosis and Spinal Muscular Atrophy (SMA) screening were also discussed with the patient. Both conditions are recessive, which means that both parents must be carriers in  order to have a child with the disease.  Cystic fibrosis (CF) is one of the most common genetic conditions in persons of Caucasian ancestry.  This condition occurs in approximately 1 in 2,500 Caucasian persons and results in thickened secretions in the lungs, digestive, and reproductive systems.  For a baby to be at risk for having CF, both of the parents must be carriers for this condition.  Approximately 1 in 5225 Caucasian persons is a carrier for CF.  Current carrier testing looks for the most common mutations in the gene for CF and can detect approximately 90% of carriers in the Caucasian population.  This means that the carrier screening can greatly reduce, but cannot eliminate, the chance for an individual to have a child with CF.  If an individual is found to be a carrier for CF, then carrier testing would be available for the partner. As part of [place] newborn screening profile, all babies born in the state of [place] will have a two-tier screening process.  Specimens are first tested to determine the concentration of immunoreactive trypsinogen (IRT).  The top 5% of specimens with the highest IRT values then undergo DNA testing using a panel of over 40 common CF mutations. SMA is a neurodegenerative disorder that leads to atrophy of skeletal muscle and overall weakness.  This condition is also more prevalent in the Caucasian population, with 1 in 40-1 in 60 persons being a carrier and 1 in 6,000-1 in 10,000 children being affected.  There are multiple forms of the disease, with some causing death in infancy to other forms with survival into adulthood.  The genetics of SMA is complex, but carrier screening can detect up to 95% of carriers in the Caucasian population.  Similar to CF, a negative result can greatly reduce, but cannot eliminate, the chance to have a child with SMA.  We obtained a detailed family history and pregnancy history.  The family history was reported to be unremarkable  for birth defects, mental retardation, recurrent pregnancy loss or known chromosome abnormalities.  Cassandra Erickson stated that this is her first pregnancy.  She reported no complications or exposures that would be expected to increase the risk for birth defects.  After consideration of the options, Ms. Jinkins elected to decline CF and SMA carrier testing.  She desired first trimester screening, however, at the time of ultrasound the gestational age was consistent with 14 weeks.  Therefore, the nuchal translucency could not be performed.  She was offered maternal serum screening in the second trimester, which [facility] be [facility] if desired.  Fetal anatomy could not be assessed due to early gestational age.  Please refer to the ultrasound report for details of that study.  Cassandra Erickson was encouraged to call with questions or concerns.  We can be contacted at [phone].    Cherly Andersoneborah F. Pluma Diniz, MS, CGC

## 2016-01-01 NOTE — Progress Notes (Signed)
I agree with the assessment and plan as outlined in CGC Wells's note.  

## 2016-01-09 ENCOUNTER — Emergency Department
Admission: EM | Admit: 2016-01-09 | Discharge: 2016-01-09 | Disposition: A | Discharge status: Home / Self Care | Payer: Managed Care, Other (non HMO) | Attending: Student | Admitting: Student

## 2016-01-09 ENCOUNTER — Encounter: Payer: Self-pay | Admitting: Emergency Medicine

## 2016-01-09 ENCOUNTER — Emergency Department: Payer: Managed Care, Other (non HMO)

## 2016-01-09 DIAGNOSIS — Z3A15 15 weeks gestation of pregnancy: Secondary | ICD-10-CM | POA: Insufficient documentation

## 2016-01-09 DIAGNOSIS — O209 Hemorrhage in early pregnancy, unspecified: Secondary | ICD-10-CM | POA: Diagnosis not present

## 2016-01-09 DIAGNOSIS — O4690 Antepartum hemorrhage, unspecified, unspecified trimester: Secondary | ICD-10-CM

## 2016-01-09 DIAGNOSIS — R319 Hematuria, unspecified: Secondary | ICD-10-CM | POA: Diagnosis present

## 2016-01-09 LAB — URINALYSIS COMPLETE WITH MICROSCOPIC (ARMC ONLY)
Bilirubin Urine: NEGATIVE
Glucose, UA: NEGATIVE mg/dL
Hgb urine dipstick: NEGATIVE
Leukocytes, UA: NEGATIVE
Nitrite: NEGATIVE
Protein, ur: NEGATIVE mg/dL
Specific Gravity, Urine: 1.005 (ref 1.005–1.030)
pH: 6 (ref 5.0–8.0)

## 2016-01-09 LAB — CBC WITH DIFFERENTIAL/PLATELET
Basophils Absolute: 0 K/uL (ref 0–0.1)
Basophils Relative: 0 %
Eosinophils Absolute: 0 K/uL (ref 0–0.7)
Eosinophils Relative: 0 %
HCT: 36.7 % (ref 35.0–47.0)
Hemoglobin: 12.9 g/dL (ref 12.0–16.0)
Lymphocytes Relative: 20 %
Lymphs Abs: 2.1 K/uL (ref 1.0–3.6)
MCH: 30.7 pg (ref 26.0–34.0)
MCHC: 35.2 g/dL (ref 32.0–36.0)
MCV: 87.3 fL (ref 80.0–100.0)
Monocytes Absolute: 0.5 K/uL (ref 0.2–0.9)
Monocytes Relative: 5 %
Neutro Abs: 7.7 K/uL — ABNORMAL HIGH (ref 1.4–6.5)
Neutrophils Relative %: 75 %
Platelets: 223 K/uL (ref 150–440)
RBC: 4.2 MIL/uL (ref 3.80–5.20)
RDW: 13.1 % (ref 11.5–14.5)
WBC: 10.3 K/uL (ref 3.6–11.0)

## 2016-01-09 LAB — BASIC METABOLIC PANEL
Anion gap: 7 (ref 5–15)
BUN: 6 mg/dL (ref 6–20)
CO2: 23 mmol/L (ref 22–32)
Calcium: 9.1 mg/dL (ref 8.9–10.3)
Chloride: 107 mmol/L (ref 101–111)
Creatinine, Ser: 0.35 mg/dL — ABNORMAL LOW (ref 0.44–1.00)
GFR calc Af Amer: >60 mL/min (ref >60)
GFR calc non Af Amer: >60 mL/min (ref >60)
Glucose, Bld: 98 mg/dL (ref 65–99)
Potassium: 3.5 mmol/L (ref 3.5–5.1)
Sodium: 137 mmol/L (ref 135–145)

## 2016-01-09 LAB — HCG, QUANTITATIVE, PREGNANCY: hCG, Beta Chain, Quant, S: 22953 m[IU]/mL — ABNORMAL HIGH

## 2016-01-09 NOTE — ED Notes (Signed)
Called for the patient from the waiting room, no answer at this time.

## 2016-01-09 NOTE — ED Notes (Signed)
Pt attempting to provide urine sample at this time. Pt aware that this is needed.

## 2016-01-09 NOTE — ED Notes (Signed)
Patient transported to Ultrasound 

## 2016-01-09 NOTE — Discharge Instructions (Signed)
No intercourse until bleeding has stopped. Do not use any tampons or put anything inside of your vagina. Pregnancy appears to be progressing as expected at this time.  Follow up with your OB/GYN. Call to schedule a follow up appointment. Return to the ER for bright red vaginal bleeding or abdominal pain/cramping.

## 2016-01-09 NOTE — ED Provider Notes (Signed)
[facility] Emergency Department Provider Note  ____________________________________________  Time seen: Approximately 5:00 PM  I have reviewed the triage vital signs and the nursing notes.   HISTORY  Chief Complaint Hematuria  HPI Cassandra Erickson is a 25 y.o. female who presents to the emergency department for evaluation of bleeding. She states that she noticed it after urinating. She is not sure if it is from the urinary tract or from the vagina. She is [redacted] weeks pregnant. She has had her initial OB/GYN screening. She states that she had active bright red bleeding in early August and was evaluated here. She states that there has been no further bright red blood, but noticed the brownish colored discharge today. She denies abdominal pain or cramping.  History reviewed. No pertinent past medical history.  Patient Active Problem List   Diagnosis Date Noted  . First trimester screening 12/29/2015    History reviewed. No pertinent surgical history.  Prior to Admission medications   Medication Sig Start Date End Date Taking? Authorizing Provider  DOCOSAHEXAENOIC ACID PO Take 1 capsule by mouth daily.    Historical Provider, MD  HYDROcodone-acetaminophen (NORCO/VICODIN) 5-325 MG tablet Take 1 tablet by mouth every 4 (four) hours as needed for moderate pain. Patient not taking: Reported on 12/29/2015 12/26/15   Tommi Rumpshonda L Summers, PA-C  Prenatal Vit-Fe Fumarate-FA (PRENATAL MULTIVITAMIN) TABS tablet Take 1 tablet by mouth daily at 12 noon.    Historical Provider, MD    Allergies Review of patient's allergies indicates no known allergies.  Family History  Problem Relation Age of Onset  . Depression Mother   . Miscarriages / [place] Mother   . Cancer Maternal Grandmother   . Cancer Paternal Grandmother     Social History Social History  Substance Use Topics  . Smoking status: Never Smoker  . Smokeless tobacco: Never Used  . Alcohol use Not on file     Review of Systems Constitutional: Negative for fever. Respiratory: Negative for shortness of breath or cough. Gastrointestinal: Negative for abdominal pain; negative for nausea , negative for vomiting. Genitourinary: Negative for dysuria , questionable for vaginal discharge. Musculoskeletal: Negative for back pain. Skin: Negative for rash, lesion, wound. ____________________________________________   PHYSICAL EXAM:  VITAL SIGNS: ED Triage Vitals  Enc Vitals Group     BP 01/09/16 1603 (!) 102/59     Pulse Rate 01/09/16 1603 92     Resp 01/09/16 1603 16     Temp 01/09/16 1603 98.2 F (36.8 C)     Temp Source 01/09/16 1603 Oral     SpO2 01/09/16 1603 99 %     Weight 01/09/16 1605 164 lb (74.4 kg)     Height 01/09/16 1605 5' (1.524 m)     Head Circumference --      Peak Flow --      Pain Score 01/09/16 1605 0     Pain Loc --      Pain Edu? --      Excl. in GC? --     Constitutional: Alert and oriented. Well appearing and in no acute distress. Eyes: Conjunctivae are normal. PERRL. EOMI. Head: Atraumatic. Nose: No congestion/rhinnorhea. Mouth/Throat: Mucous membranes are moist. Respiratory: Normal respiratory effort.  No retractions. Gastrointestinal: Soft, nontender, no guarding or rebound. Genitourinary: Pelvic exam: Deferred Musculoskeletal: No extremity tenderness nor edema.  Neurologic:  Normal speech and language. No gross focal neurologic deficits are appreciated. Speech is normal. No gait instability. Skin:  Skin is warm, dry and intact.  No rash noted. Psychiatric: Mood and affect are normal. Speech and behavior are normal.  ____________________________________________   LABS (all labs ordered are listed, but only abnormal results are displayed)  Labs Reviewed  URINALYSIS COMPLETEWITH MICROSCOPIC ([facility] ONLY) - Abnormal; Notable for the following:       Result Value   Color, Urine STRAW (*)    APPearance CLEAR (*)    Ketones, ur 1+ (*)    Bacteria, UA  RARE (*)    Squamous Epithelial / LPF 0-5 (*)    All other components within normal limits  BASIC METABOLIC PANEL - Abnormal; Notable for the following:    Creatinine, Ser 0.35 (*)    All other components within normal limits  CBC WITH DIFFERENTIAL/PLATELET - Abnormal; Notable for the following:    Neutro Abs 7.7 (*)    All other components within normal limits  HCG, QUANTITATIVE, PREGNANCY - Abnormal; Notable for the following:    hCG, Beta Chain, Quant, S 22,953 (*)    All other components within normal limits   ____________________________________________  RADIOLOGY  Cervix appears closed. Fetal heart rate of 158 bpm. Fetal movement was observed.  ____________________________________________   PROCEDURES  Procedure(s) performed: None  ____________________________________________   INITIAL IMPRESSION / ASSESSMENT AND PLAN / ED COURSE  Pertinent labs & imaging results that were available during my care of the patient were reviewed by me and considered in my medical decision making (see chart for details).  Patient is gravida 1 para 0 A 0.  Urinalysis does not explain the reason for noticing blood upon urination. Patient is documented as A+, therefore Rhogam is not indicated. Pelvic exam will not be performed today, as she has had recent STD screening and denies vaginal discharge with the exception of the blood noted today. Beta hCG will be ordered then ultrasound.  She was advised to follow up with her OBGYN. She was advised to return to the ER for symptoms that change or worsen if unable to schedule an appointment. ____________________________________________   FINAL CLINICAL IMPRESSION(S) / ED DIAGNOSES  Final diagnoses:  Vaginal bleeding during pregnancy, antepartum    Note:  This document was prepared using Dragon voice recognition software and may include unintentional dictation errors.    Chinita Pester, FNP 01/09/16 2248    Gayla Doss, MD 01/10/16  [phone]

## 2016-01-09 NOTE — ED Triage Notes (Signed)
[redacted] weeks pregnant.  Today at 1400 patient had hematuria.  Denies pain.

## 2016-01-09 NOTE — ED Notes (Signed)
Pt sleeping, resps unlabored.  

## 2016-01-09 NOTE — ED Notes (Signed)
Pt provided with gown, blankets and updated on ultrasound process.

## 2016-03-08 ENCOUNTER — Observation Stay
Admission: EM | Admit: 2016-03-08 | Discharge: 2016-03-08 | Disposition: A | Discharge status: Home / Self Care | Payer: Managed Care, Other (non HMO) | Attending: Obstetrics and Gynecology | Admitting: Obstetrics and Gynecology

## 2016-03-08 DIAGNOSIS — O26852 Spotting complicating pregnancy, second trimester: Secondary | ICD-10-CM | POA: Diagnosis present

## 2016-03-08 DIAGNOSIS — Z3A23 23 weeks gestation of pregnancy: Secondary | ICD-10-CM | POA: Insufficient documentation

## 2016-03-08 DIAGNOSIS — O4692 Antepartum hemorrhage, unspecified, second trimester: Secondary | ICD-10-CM | POA: Diagnosis present

## 2016-03-08 DIAGNOSIS — N939 Abnormal uterine and vaginal bleeding, unspecified: Secondary | ICD-10-CM | POA: Diagnosis present

## 2016-03-08 LAB — CBC
HCT: 34.1 % — ABNORMAL LOW (ref 35.0–47.0)
Hemoglobin: 11.7 g/dL — ABNORMAL LOW (ref 12.0–16.0)
MCH: 30.2 pg (ref 26.0–34.0)
MCHC: 34.3 g/dL (ref 32.0–36.0)
MCV: 88.1 fL (ref 80.0–100.0)
Platelets: 199 10*3/uL (ref 150–440)
RBC: 3.87 MIL/uL (ref 3.80–5.20)
RDW: 14 % (ref 11.5–14.5)
WBC: 10.2 10*3/uL (ref 3.6–11.0)

## 2016-03-08 LAB — URINALYSIS COMPLETE WITH MICROSCOPIC (ARMC ONLY)
Bacteria, UA: NONE SEEN
Bilirubin Urine: NEGATIVE
Glucose, UA: 150 mg/dL — AB
Hgb urine dipstick: NEGATIVE
Ketones, ur: NEGATIVE mg/dL
Leukocytes, UA: NEGATIVE
Nitrite: NEGATIVE
Protein, ur: NEGATIVE mg/dL
Specific Gravity, Urine: 1.019 (ref 1.005–1.030)
pH: 7 (ref 5.0–8.0)

## 2016-03-08 MED ORDER — ACETAMINOPHEN 325 MG PO TABS: 650.0000 mg | ORAL_TABLET | ORAL | Status: DC | PRN

## 2016-03-08 NOTE — Progress Notes (Signed)
Bari MantisCourtney N Armas 06/27/1990 G1 P0 [id number] presents for vaginal spotting post intercourse .  No LOF , + vaginal bleeding , U/S 3 weeks ago shows anterior placenta . C/o of vaginal pain for 2 days O;BP 119/70 (BP Location: Left Arm)   Pulse 85   Temp 98.4 F (36.9 C) (Oral)   Resp 18   Ht 5' (1.524 m)   Wt 171 lb (77.6 kg)   LMP 09/19/2015   BMI 33.40 kg/m  ABDsoft NT   CX closed 40 % . OOP . No blood  NST reassuring for 23 weeks  Labs:ua  And HCT both nl  A: vaginal spotting post intercourse P:d/c home with precautions  Patient ID: Bari MantisCourtney N Landry, female   DOB: 09/14/1990, 25 y.o.   MRN: [id number]

## 2016-03-08 NOTE — OB Triage Note (Signed)
Patient is a G1P0 who states that she had a moderate amount of bright red vaginal bleeding on 03/08/16. Patient also reports pelvic pain that radiates into her legs. Patient reports sexual activity on 03/07/16.

## 2016-03-08 NOTE — Discharge Summary (Signed)
  Suzy Bouchardhomas J Schermerhorn, MD  Obstetrics    [] Hide copied text [] Hover for attribution information Cassandra MantisCourtney N Machorro 12/26/1990 G1 P0 [id number] presents for vaginal spotting post intercourse .  No LOF , + vaginal bleeding , U/S 3 weeks ago shows anterior placenta . C/o of vaginal pain for 2 days O;BP 119/70 (BP Location: Left Arm)   Pulse 85   Temp 98.4 F (36.9 C) (Oral)   Resp 18   Ht 5' (1.524 m)   Wt 171 lb (77.6 kg)   LMP 09/19/2015   BMI 33.40 kg/m  ABDsoft NT   CX closed 40 % . OOP . No blood  NST reassuring for 23 weeks  Labs:ua  And HCT both nl  A: vaginal spotting post intercourse P:d/c home with precautions  Patient ID: Cassandra Erickson, female   DOB: 09/14/1990, 25 y.o.   MRN: [id number]     Electronically signed by Suzy Bouchardhomas J Schermerhorn, MD at 03/08/2016 2:28 PM

## 2016-03-08 NOTE — OB Triage Note (Signed)
No vaginal bleeding noted upon patient being in bed x 30 minutes. Patient states that no blood was noted on the tissue after wiping post-void.

## 2016-04-09 ENCOUNTER — Ambulatory Visit
Admission: EM | Admit: 2016-04-09 | Discharge: 2016-04-09 | Disposition: A | Discharge status: Home / Self Care | Payer: Managed Care, Other (non HMO) | Attending: Family Medicine | Admitting: Family Medicine

## 2016-04-09 DIAGNOSIS — M25571 Pain in right ankle and joints of right foot: Secondary | ICD-10-CM

## 2016-04-09 MED ORDER — ACETAMINOPHEN 325 MG PO TABS: 650.0000 mg | ORAL_TABLET | Freq: Four times a day (QID) | ORAL | 0 refills | Status: DC | PRN

## 2016-04-09 NOTE — ED Triage Notes (Signed)
Patient complains of right ankle pain that started on . Patient states that she twisted her ankle three time yesterday stepping off of a porch. Patient reports that she is [redacted] weeks pregnant.

## 2016-04-09 NOTE — ED Provider Notes (Signed)
[facility]    CSN: [id number] Arrival date & time: 04/09/16  1311     History   Chief Complaint Chief Complaint  Patient presents with  . Ankle Pain    HPI Cassandra Erickson is a 25 y.o. female.   The history is provided by the patient.    History reviewed. No pertinent past medical history.  Patient Active Problem List   Diagnosis Date Noted  . Vaginal bleeding 03/08/2016  . Vaginal bleeding in pregnancy, second trimester 03/08/2016  . First trimester screening 12/29/2015    Past Surgical History:  Procedure Laterality Date  . NO PAST SURGERIES      OB History    Gravida Para Term Preterm AB Living   1             SAB TAB Ectopic Multiple Live Births                   Home Medications    Prior to Admission medications   Medication Sig Start Date End Date Taking? Authorizing Provider  Prenatal Vit-Fe Fumarate-FA (PRENATAL MULTIVITAMIN) TABS tablet Take 1 tablet by mouth daily at 12 noon.   Yes Historical Provider, MD  acetaminophen (TYLENOL) 325 MG tablet Take 2 tablets (650 mg total) by mouth every 6 (six) hours as needed. 04/09/16   Duanne Limerickeanna C Jones, MD  DOCOSAHEXAENOIC ACID PO Take 1 capsule by mouth daily.    Historical Provider, MD  HYDROcodone-acetaminophen (NORCO/VICODIN) 5-325 MG tablet Take 1 tablet by mouth every 4 (four) hours as needed for moderate pain. Patient not taking: Reported on 03/08/2016 12/26/15   Tommi Rumpshonda L Summers, PA-C    Family History Family History  Problem Relation Age of Onset  . Depression Mother   . Miscarriages / [place] Mother   . Cancer Maternal Grandmother   . Cancer Paternal Grandmother     Social History Social History  Substance Use Topics  . Smoking status: Never Smoker  . Smokeless tobacco: Never Used  . Alcohol use No     Allergies   Patient has no known allergies.   Review of Systems Review of Systems  Constitutional: Negative for appetite change, chills, diaphoresis, fatigue and  fever.  HENT: Negative for congestion, dental problem, drooling, ear discharge, ear pain, facial swelling, hearing loss, mouth sores, nosebleeds, postnasal drip, rhinorrhea, sinus pain, sinus pressure, sneezing, sore throat, tinnitus, trouble swallowing and voice change.   Eyes: Negative for photophobia, pain, discharge, redness, itching and visual disturbance.  Respiratory: Negative for apnea, cough, choking, chest tightness, shortness of breath, wheezing and stridor.   Cardiovascular: Negative for chest pain, palpitations and leg swelling.  Gastrointestinal: Negative for abdominal distention, abdominal pain, anal bleeding, blood in stool, constipation, diarrhea, nausea, rectal pain and vomiting.  Genitourinary: Negative for dysuria and hematuria.  Musculoskeletal: Positive for arthralgias and joint swelling. Negative for back pain, gait problem, myalgias, neck pain and neck stiffness.     Physical Exam Triage Vital Signs ED Triage Vitals  Enc Vitals Group     BP 04/09/16 1429 114/72     Pulse Rate 04/09/16 1429 98     Resp 04/09/16 1429 16     Temp 04/09/16 1429 98.1 F (36.7 C)     Temp Source 04/09/16 1429 Tympanic     SpO2 04/09/16 1429 98 %     Weight 04/09/16 1429 175 lb (79.4 kg)     Height 04/09/16 1429 5' (1.524 m)     Head Circumference --  Peak Flow --      Pain Score 04/09/16 1428 10     Pain Loc --      Pain Edu? --      Excl. in GC? --    No data found.   Updated Vital Signs BP 114/72 (BP Location: Left Arm)   Pulse 98   Temp 98.1 F (36.7 C) (Tympanic)   Resp 16   Ht 5' (1.524 m)   Wt 175 lb (79.4 kg)   LMP 09/19/2015   SpO2 98%   BMI 34.18 kg/m   Visual Acuity Right Eye Distance:   Left Eye Distance:   Bilateral Distance:    Right Eye Near:   Left Eye Near:    Bilateral Near:     Physical Exam  Constitutional: No distress.  HENT:  Head: Normocephalic and atraumatic.  Right Ear: External ear normal.  Left Ear: External ear normal.    Nose: Nose normal.  Mouth/Throat: Oropharynx is clear and moist.  Eyes: Conjunctivae and EOM are normal. Pupils are equal, round, and reactive to light. Right eye exhibits no discharge. Left eye exhibits no discharge.  Neck: Normal range of motion. Neck supple. No JVD present. No thyromegaly present.  Cardiovascular: Normal rate, regular rhythm, S1 normal, S2 normal, normal heart sounds and intact distal pulses.  Exam reveals no gallop, no S3, no S4 and no friction rub.   No murmur heard. Pulmonary/Chest: Effort normal and breath sounds normal. No respiratory distress.  Abdominal: Soft. Bowel sounds are normal. She exhibits no mass. There is no tenderness. There is no guarding.  Musculoskeletal: She exhibits no edema.       Right ankle: She exhibits decreased range of motion, swelling and ecchymosis. She exhibits no deformity, no laceration and normal pulse. Tenderness. Lateral malleolus, AITFL, CF ligament and posterior TFL tenderness found. No medial malleolus, no head of 5th metatarsal and no proximal fibula tenderness found. Achilles tendon normal.  Lymphadenopathy:    She has no cervical adenopathy.  Neurological: She is alert. She has normal reflexes.  Skin: Skin is warm and dry. She is not diaphoretic.  Nursing note and vitals reviewed.    UC Treatments / Results  Labs (all labs ordered are listed, but only abnormal results are displayed) Labs Reviewed - No data to display  EKG  EKG Interpretation None       Radiology No results found.  Procedures Procedures (including critical care time)  Medications Ordered in UC Medications - No data to display   Initial Impression / Assessment and Plan / UC Course  I have reviewed the triage vital signs and the nursing notes.  Pertinent labs & imaging results that were available during my care of the patient were reviewed by me and considered in my medical decision making (see chart for details).  Clinical Course        Final Clinical Impressions(s) / UC Diagnoses   Final diagnoses:  Acute right ankle pain    New Prescriptions New Prescriptions   ACETAMINOPHEN (TYLENOL) 325 MG TABLET    Take 2 tablets (650 mg total) by mouth every 6 (six) hours as needed.     Duanne Limerickeanna C Jones, MD 04/09/16 [phone]

## 2016-04-11 ENCOUNTER — Emergency Department: Payer: Managed Care, Other (non HMO)

## 2016-04-11 ENCOUNTER — Emergency Department
Admission: EM | Admit: 2016-04-11 | Discharge: 2016-04-11 | Disposition: A | Discharge status: Home / Self Care | Payer: Managed Care, Other (non HMO) | Attending: Emergency Medicine | Admitting: Emergency Medicine

## 2016-04-11 ENCOUNTER — Encounter: Payer: Self-pay | Admitting: Medical Oncology

## 2016-04-11 DIAGNOSIS — O9A212 Injury, poisoning and certain other consequences of external causes complicating pregnancy, second trimester: Secondary | ICD-10-CM | POA: Diagnosis present

## 2016-04-11 DIAGNOSIS — Y939 Activity, unspecified: Secondary | ICD-10-CM | POA: Diagnosis not present

## 2016-04-11 DIAGNOSIS — W1789XA Other fall from one level to another, initial encounter: Secondary | ICD-10-CM | POA: Diagnosis not present

## 2016-04-11 DIAGNOSIS — S93401A Sprain of unspecified ligament of right ankle, initial encounter: Secondary | ICD-10-CM | POA: Insufficient documentation

## 2016-04-11 DIAGNOSIS — Y999 Unspecified external cause status: Secondary | ICD-10-CM | POA: Insufficient documentation

## 2016-04-11 DIAGNOSIS — Z79899 Other long term (current) drug therapy: Secondary | ICD-10-CM | POA: Diagnosis not present

## 2016-04-11 DIAGNOSIS — Z3A20 20 weeks gestation of pregnancy: Secondary | ICD-10-CM | POA: Diagnosis not present

## 2016-04-11 DIAGNOSIS — Y92008 Other place in unspecified non-institutional (private) residence as the place of occurrence of the external cause: Secondary | ICD-10-CM | POA: Insufficient documentation

## 2016-04-11 NOTE — ED Notes (Signed)
Pt up for discharge, awaiting paper work from MD

## 2016-04-11 NOTE — ED Notes (Signed)
Fetal heart tone rate 158.

## 2016-04-11 NOTE — ED Provider Notes (Signed)
[facility] Emergency Department Provider Note ____________________________________________   I have reviewed the triage vital signs and the triage nursing note.  HISTORY  Chief Complaint Foot Pain   Historian Patient  HPI Cassandra Erickson is a 25 y.o. female approx 28w pregnant, fell off a porch 2 days ago and had ankle pain, without any other traumatic injuries. She went to the walk-in clinic and did not have an x-ray as she isn't walking on it. Now it's extremely swollen and bruised and painful to walk on.  Again no head injury, other extremity injuries, or abdominal injury.  She has felt the baby moving less frequently, but no dysuria, vaginal fluid loss or vaginal bleeding, or abdominal pain.    History reviewed. No pertinent past medical history.  Patient Active Problem List   Diagnosis Date Noted  . Vaginal bleeding 03/08/2016  . Vaginal bleeding in pregnancy, second trimester 03/08/2016  . First trimester screening 12/29/2015    Past Surgical History:  Procedure Laterality Date  . NO PAST SURGERIES      Prior to Admission medications   Medication Sig Start Date End Date Taking? Authorizing Provider  acetaminophen (TYLENOL) 325 MG tablet Take 2 tablets (650 mg total) by mouth every 6 (six) hours as needed. 04/09/16   Duanne Limerickeanna C Jones, MD  DOCOSAHEXAENOIC ACID PO Take 1 capsule by mouth daily.    Historical Provider, MD  HYDROcodone-acetaminophen (NORCO/VICODIN) 5-325 MG tablet Take 1 tablet by mouth every 4 (four) hours as needed for moderate pain. Patient not taking: Reported on 03/08/2016 12/26/15   Tommi Rumpshonda L Summers, PA-C  Prenatal Vit-Fe Fumarate-FA (PRENATAL MULTIVITAMIN) TABS tablet Take 1 tablet by mouth daily at 12 noon.    Historical Provider, MD    No Known Allergies  Family History  Problem Relation Age of Onset  . Depression Mother   . Miscarriages / [place] Mother   . Cancer Maternal Grandmother   . Cancer Paternal  Grandmother     Social History Social History  Substance Use Topics  . Smoking status: Never Smoker  . Smokeless tobacco: Never Used  . Alcohol use No    Review of Systems  Constitutional: Negative for Traumatic injury other than the right ankle. Eyes: Negative for visual changes. ENT: Negative for Traumatic injury to the face Cardiovascular: Negative for chest pain. Respiratory: Negative for shortness of breath. Gastrointestinal: Negative for abdominal pain. Genitourinary: Negative for dysuria. Musculoskeletal: Negative for back pain. Skin: Negative for rash. Neurological: Negative for headache. 10 point Review of Systems otherwise negative ____________________________________________   PHYSICAL EXAM:  VITAL SIGNS: ED Triage Vitals  Enc Vitals Group     BP 04/11/16 1015 111/73     Pulse Rate 04/11/16 1015 89     Resp 04/11/16 1015 16     Temp 04/11/16 1015 97.9 F (36.6 C)     Temp Source 04/11/16 1015 Oral     SpO2 04/11/16 1015 98 %     Weight 04/11/16 1013 175 lb (79.4 kg)     Height 04/11/16 1013 5\' 4"  (1.626 m)     Head Circumference --      Peak Flow --      Pain Score 04/11/16 1013 10     Pain Loc --      Pain Edu? --      Excl. in GC? --      Constitutional: Alert and oriented. Well appearing and in no distress. HEENT   Head: Normocephalic and atraumatic.  Eyes: Conjunctivae are normal. PERRL. Normal extraocular movements.      Ears:         Nose: No congestion/rhinnorhea.   Mouth/Throat: Mucous membranes are moist.   Neck: No stridor. Cardiovascular/Chest: Normal rate, regular rhythm.  No murmurs, rubs, or gallops. Respiratory: Normal respiratory effort without tachypnea nor retractions. Breath sounds are clear and equal bilaterally. No wheezes/rales/rhonchi. Gastrointestinal: Soft. No distention, no guarding, no rebound. Nontender.  Gravid above the umbilicus.  Genitourinary/rectal:Deferred Musculoskeletal: Right ankle severe  ecchymosis and moderate swelling with tenderness especially along the lateral and anterior aspect of the ankle. Neurovascularly intact. No other extremity injuries on exam. Neurologic:  Normal speech and language. No gross or focal neurologic deficits are appreciated. Skin:  Skin is warm, dry and intact. No rash noted. Psychiatric: Mood and affect are normal. Speech and behavior are normal. Patient exhibits appropriate insight and judgment.   ____________________________________________  LABS (pertinent positives/negatives)  Labs Reviewed - No data to display  ____________________________________________    EKG I, Governor Rooksebecca Shai Mckenzie, MD, the attending physician have personally viewed and interpreted all ECGs.  None ____________________________________________  RADIOLOGY All Xrays were viewed by me. Imaging interpreted by Radiologist.  Right ankle complete: IMPRESSION: No acute abnormality noted.  __________________________________________  PROCEDURES  Procedure(s) performed: None  Critical Care performed: None  ____________________________________________   ED COURSE / ASSESSMENT AND PLAN  Pertinent labs & imaging results that were available during my care of the patient were reviewed by me and considered in my medical decision making (see chart for details).   Cassandra Erickson is here for x-ray evaluation of her right ankle which she landed on after a fall 2 days ago and now is more significant only swollen and painful.  She is [redacted] weeks pregnant and states that she's felt the baby moving less frequently, and so I did do fetal heart tones. No additional complaints in terms of traumatic injury to her abdomen, nor other pregnancy related complaints.  No other traumatic injuries from the fall reported or on exam.  Ankle xray without fracture, discussed sprain treatment and followup with ortho.  FHT in 150s.  Recheck 3:30-  No abdominal pain, vaginal or urinary complaints.  She  would like to go home.  I've asked her to follow up with her ob gyn.    CONSULTATIONS:   None   Patient / Family / Caregiver informed of clinical course, medical decision-making process, and agree with plan.   I discussed return precautions, follow-up instructions, and discharge instructions with patient and/or family.   ___________________________________________   FINAL CLINICAL IMPRESSION(S) / ED DIAGNOSES   Final diagnoses:  Sprain of right ankle, unspecified ligament, initial encounter              Note: This dictation was prepared with Dragon dictation. Any transcriptional errors that result from this process are unintentional    Governor Rooksebecca Wayne Brunker, MD 04/11/16 1551

## 2016-04-11 NOTE — ED Triage Notes (Signed)
Pt reports she fell off of porch two days ago and injured rt foot. Pt is [redacted] weeks pregnant and also reports that she has noticed decreased fetal movement since yesterday. Pt denies other pregnancy related complaints.

## 2016-04-11 NOTE — ED Notes (Signed)
Pt given water and crackers. Updated on status.

## 2016-04-11 NOTE — Discharge Instructions (Signed)
Your x-ray shows no evidence of fracture, and as we discussed this is likely a sprain. You are referred to follow-up with orthopedics, you may need to be referred to physical therapy at some point for healing.  Return to the emergency department for any worsening pain, swelling, numbness or tingling.  In terms of baby movement, the heart beat was good here in the emergency department. Return to the emergency department if you have any vaginal bleeding, vaginal discharge, abdominal pain, or any other concerns. Please call your OB/GYN doctor to discuss follow-up, call tomorrow.

## 2016-05-17 NOTE — L&D Delivery Note (Signed)
Pushing with pt at intervals with pt being complete since 0340am but, never felt the urge to push strongly. At 0600am, started pushing with pt and she was able to move the baby down. Pt was exhausted and could only push for short intervals and epidural was turned down to get pt to push more effectively. When pt would push, the baby would come down but, she could not hold the baby there. So, after multiple hours of pushing and encouragement, the baby was delivered in a vtx position and then a shoulder dystocia was called as the Anterior and post shoulder would not move. Shoulder dystocia called and 2 extra nurses in room with the NICU team. After 1 further attempt the Ant shoulder del and then the post shoulder and the body and delivery occurred at     Am. The baby was pale and floppy and CCx2 and cut and to the warmer. The NICU team with baby and Dr Cleatis PolkaAuten attended immediately. The baby was stunned and was breathing but, color was pale and sl mottled until O2 was given per PPV./ Tone and color improved.  VAGINAL DELIVERY NOTE:  Date of Delivery: 07/03/2016 Primary OBJonathon Erickson: [facility] Gestational Age/EDD: 261w3d 06/30/2016, Alternate EDD Entry Antepartum complications:Obesity,  Attending Physician: CJones, CNM Delivery Type: NSVD of viable female infant Anesthesia: Epidural  Laceration: 2nd degree perineal Episiotomy:None Placenta: SDOP intact Intrapartum complications: Prolonged 2nd stage due to pt exhausted and not pushing effectively and when she would push, the baby descended.  Estimated Blood Loss: 200 ml's GBS: GBS neg  Procedure Details: Pushing with pt and Vtx sitting at a +4/+5 station for a long period. Vtx delivered and attempted to get Ant shoulder but, it would not come. Post shoulder not moving in any direction. CAN x 1 loose reduced. Shoulder dystocia emergency team called: NICU team, extra L&D Nurses, and provider in room. Enc pt to push and with downward traction the Ant shoulder delivered and  then the pt encouraged to push and the post shoulder delivered as well as the body. The baby was pale and floppy and CCx2 and cut and to the warmer where the NICU nurse was and a 2nd nurse arrived within 1 min and then Dr Cleatis PolkaAuten within 2 mins. Initially the baby required stimulus, then O2 per PPV. Baby was crying and whimpering. 2nd degree perienal lac repaired with 3-0 CH on CT. SDOP intact and no missing cotylyedons were seen. FF and lochia mod. VSS. Hemostasis achieved. No cord blood needed as pt is A pos.The NICU team was stabilizing the baby and then went to the NICU for a chest xray on baby. Pt and family updated on baby at intervals.   Baby: Liveborn:female, Apgars , weight #, oz, baby named "Cassandra Erickson"

## 2016-06-09 LAB — OB RESULTS CONSOLE GC/CHLAMYDIA
Chlamydia: NEGATIVE
Gonorrhea: NEGATIVE

## 2016-06-09 LAB — OB RESULTS CONSOLE GBS
GBS: NEGATIVE
GBS: NEGATIVE
GBS: NEGATIVE

## 2016-07-02 ENCOUNTER — Inpatient Hospital Stay
Admission: EM | Admit: 2016-07-02 | Discharge: 2016-07-05 | DRG: 775 | Disposition: A | Discharge status: Home / Self Care | Payer: Managed Care, Other (non HMO) | Attending: Obstetrics and Gynecology | Admitting: Obstetrics and Gynecology

## 2016-07-02 ENCOUNTER — Inpatient Hospital Stay: Payer: Managed Care, Other (non HMO) | Admitting: Anesthesiology

## 2016-07-02 DIAGNOSIS — O99214 Obesity complicating childbirth: Secondary | ICD-10-CM | POA: Diagnosis present

## 2016-07-02 DIAGNOSIS — Z6838 Body mass index (BMI) 38.0-38.9, adult: Secondary | ICD-10-CM | POA: Diagnosis not present

## 2016-07-02 DIAGNOSIS — Z369 Encounter for antenatal screening, unspecified: Secondary | ICD-10-CM

## 2016-07-02 DIAGNOSIS — Z3493 Encounter for supervision of normal pregnancy, unspecified, third trimester: Secondary | ICD-10-CM | POA: Diagnosis present

## 2016-07-02 DIAGNOSIS — E669 Obesity, unspecified: Secondary | ICD-10-CM | POA: Diagnosis present

## 2016-07-02 DIAGNOSIS — F418 Other specified anxiety disorders: Secondary | ICD-10-CM | POA: Diagnosis present

## 2016-07-02 DIAGNOSIS — O99344 Other mental disorders complicating childbirth: Secondary | ICD-10-CM | POA: Diagnosis present

## 2016-07-02 DIAGNOSIS — Z3A40 40 weeks gestation of pregnancy: Secondary | ICD-10-CM | POA: Diagnosis not present

## 2016-07-02 LAB — CBC
HCT: 34.2 % — ABNORMAL LOW (ref 35.0–47.0)
Hemoglobin: 11.6 g/dL — ABNORMAL LOW (ref 12.0–16.0)
MCH: 28.4 pg (ref 26.0–34.0)
MCHC: 34 g/dL (ref 32.0–36.0)
MCV: 83.6 fL (ref 80.0–100.0)
Platelets: 190 10*3/uL (ref 150–440)
RBC: 4.1 MIL/uL (ref 3.80–5.20)
RDW: 15.6 % — ABNORMAL HIGH (ref 11.5–14.5)
WBC: 13.2 10*3/uL — ABNORMAL HIGH (ref 3.6–11.0)

## 2016-07-02 LAB — TYPE AND SCREEN
ABO/RH(D): A POS
Antibody Screen: NEGATIVE

## 2016-07-02 LAB — CHLAMYDIA/NGC RT PCR (ARMC ONLY)
Chlamydia Tr: NOT DETECTED
N gonorrhoeae: NOT DETECTED

## 2016-07-02 LAB — RAPID HIV SCREEN (HIV 1/2 AB+AG)
HIV 1/2 Antibodies: NONREACTIVE
HIV-1 P24 Antigen - HIV24: NONREACTIVE

## 2016-07-02 MED ORDER — FENTANYL 2.5 MCG/ML W/ROPIVACAINE 0.2% IN NS 100 ML EPIDURAL INFUSION (ARMC-ANES)
EPIDURAL | Status: DC | PRN
  Administered 2016-07-02: 10 mL/h via EPIDURAL

## 2016-07-02 MED ORDER — OXYCODONE-ACETAMINOPHEN 5-325 MG PO TABS: 1.0000 | ORAL_TABLET | ORAL | Status: DC | PRN

## 2016-07-02 MED ORDER — LACTATED RINGERS IV SOLN: 500.0000 mL | Freq: Once | INTRAVENOUS | Status: DC

## 2016-07-02 MED ORDER — LIDOCAINE-EPINEPHRINE (PF) 1.5 %-1:200000 IJ SOLN
INTRAMUSCULAR | Status: DC | PRN
  Administered 2016-07-02: 3 mL via EPIDURAL

## 2016-07-02 MED ORDER — SOD CITRATE-CITRIC ACID 500-334 MG/5ML PO SOLN
30.0000 mL | ORAL | Status: DC | PRN
  Filled 2016-07-02: qty 30

## 2016-07-02 MED ORDER — OXYTOCIN 10 UNIT/ML IJ SOLN
INTRAMUSCULAR | Status: AC
  Filled 2016-07-02: qty 2

## 2016-07-02 MED ORDER — LACTATED RINGERS IV SOLN
500.0000 mL | INTRAVENOUS | Status: DC | PRN
  Administered 2016-07-02: 500 mL via INTRAVENOUS

## 2016-07-02 MED ORDER — DIPHENHYDRAMINE HCL 50 MG/ML IJ SOLN: 12.5000 mg | INTRAMUSCULAR | Status: DC | PRN

## 2016-07-02 MED ORDER — OXYCODONE-ACETAMINOPHEN 5-325 MG PO TABS
2.0000 | ORAL_TABLET | ORAL | Status: DC | PRN
  Administered 2016-07-03: 2 via ORAL
  Filled 2016-07-02: qty 2

## 2016-07-02 MED ORDER — BUTORPHANOL TARTRATE 1 MG/ML IJ SOLN: 1.0000 mg | INTRAMUSCULAR | Status: DC | PRN

## 2016-07-02 MED ORDER — LIDOCAINE HCL (PF) 1 % IJ SOLN
INTRAMUSCULAR | Status: AC
  Filled 2016-07-02: qty 30

## 2016-07-02 MED ORDER — PHENYLEPHRINE 40 MCG/ML (10ML) SYRINGE FOR IV PUSH (FOR BLOOD PRESSURE SUPPORT)
80.0000 ug | PREFILLED_SYRINGE | INTRAVENOUS | Status: DC | PRN
  Filled 2016-07-02: qty 5

## 2016-07-02 MED ORDER — MISOPROSTOL 200 MCG PO TABS
ORAL_TABLET | ORAL | Status: AC
  Filled 2016-07-02: qty 4

## 2016-07-02 MED ORDER — LIDOCAINE HCL (PF) 1 % IJ SOLN: 30.0000 mL | INTRAMUSCULAR | Status: DC | PRN

## 2016-07-02 MED ORDER — EPHEDRINE 5 MG/ML INJ
10.0000 mg | INTRAVENOUS | Status: DC | PRN
  Filled 2016-07-02: qty 2

## 2016-07-02 MED ORDER — OXYTOCIN BOLUS FROM INFUSION
500.0000 mL | Freq: Once | INTRAVENOUS | Status: AC
  Administered 2016-07-03: 500 mL via INTRAVENOUS

## 2016-07-02 MED ORDER — FENTANYL 2.5 MCG/ML W/ROPIVACAINE 0.2% IN NS 100 ML EPIDURAL INFUSION (ARMC-ANES)
10.0000 mL/h | EPIDURAL | Status: AC
  Filled 2016-07-02 (×2): qty 100

## 2016-07-02 MED ORDER — LACTATED RINGERS IV SOLN
INTRAVENOUS | Status: DC
  Administered 2016-07-02 – 2016-07-03 (×2): via INTRAVENOUS

## 2016-07-02 MED ORDER — LIDOCAINE HCL (PF) 1 % IJ SOLN
INTRAMUSCULAR | Status: DC | PRN
  Administered 2016-07-02: 1.2 mL via INTRADERMAL

## 2016-07-02 MED ORDER — ACETAMINOPHEN 325 MG PO TABS: 650.0000 mg | ORAL_TABLET | ORAL | Status: DC | PRN

## 2016-07-02 MED ORDER — OXYTOCIN 40 UNITS IN LACTATED RINGERS INFUSION - SIMPLE MED
2.5000 [IU]/h | INTRAVENOUS | Status: DC
  Administered 2016-07-03: 0.04 [IU]/h via INTRAVENOUS
  Filled 2016-07-02: qty 1000

## 2016-07-02 MED ORDER — AMMONIA AROMATIC IN INHA
RESPIRATORY_TRACT | Status: AC
  Filled 2016-07-02: qty 10

## 2016-07-02 MED ORDER — ONDANSETRON HCL 4 MG/2ML IJ SOLN
4.0000 mg | Freq: Four times a day (QID) | INTRAMUSCULAR | Status: DC | PRN
  Administered 2016-07-03: 4 mg via INTRAVENOUS
  Filled 2016-07-02: qty 2

## 2016-07-02 NOTE — Anesthesia Preprocedure Evaluation (Signed)
Anesthesia Evaluation  Patient identified by MRN, date of birth, ID band Patient awake    Reviewed: Allergy & Precautions, NPO status , Patient's Chart, lab work & pertinent test results  History of Anesthesia Complications Negative for: history of anesthetic complications  Airway Mallampati: II       Dental   Pulmonary neg pulmonary ROS,           Cardiovascular negative cardio ROS       Neuro/Psych negative neurological ROS     GI/Hepatic Neg liver ROS, GERD (with pregnancy)  ,  Endo/Other  negative endocrine ROS  Renal/GU negative Renal ROS     Musculoskeletal   Abdominal   Peds  Hematology negative hematology ROS (+)   Anesthesia Other Findings   Reproductive/Obstetrics                             Anesthesia Physical Anesthesia Plan  ASA: II  Anesthesia Plan: Epidural   Post-op Pain Management:    Induction:   Airway Management Planned:   Additional Equipment:   Intra-op Plan:   Post-operative Plan:   Informed Consent: I have reviewed the patients History and Physical, chart, labs and discussed the procedure including the risks, benefits and alternatives for the proposed anesthesia with the patient or authorized representative who has indicated his/her understanding and acceptance.     Plan Discussed with:   Anesthesia Plan Comments:         Anesthesia Quick Evaluation

## 2016-07-02 NOTE — Progress Notes (Addendum)
Bari MantisCourtney N Kingry is a 26 y.o. G1P0 at 5135w2d by LMP with EDD of 06/30/16.  Subjective: "Hurting with UC's"  Objective: BP 124/74   Pulse 84   Temp 98.2 F (36.8 C) (Oral)   Resp 20   Ht 5' (1.524 m)   Wt 88.5 kg (195 lb)   LMP 09/19/2015   BMI 38.08 kg/m   UC:: q 2 mins, mod to palpation, lasts 60 sec FHT: 145, +accels, occas variable to 130 x 15 secs, Cat 2    SVE:   Dilation: 4/90%/vtx-1, AROM for blood tinged clear fluid Labs: Lab Results  Component Value Date   WBC 13.2 (H) 07/02/2016   HGB 11.6 (L) 07/02/2016   HCT 34.2 (L) 07/02/2016   MCV 83.6 07/02/2016   PLT 190 07/02/2016    Assessment / Plan: A: IUP at 40 2/7 weeks 2. Early labor pattern 3. GBS neg P: Plans epidural for pain 2.GBS neg 3. Anticipate SVD.   Sharee PimpleCaron W Jones 07/02/2016, 4:50 PM

## 2016-07-02 NOTE — Progress Notes (Signed)
Cassandra Erickson is a 26 y.o. G1P0 at 5195w2d   Subjective: Feeling a pain in the Lt buttocks  Objective: BP 132/67   Pulse 91   Temp 98 F (36.7 C) (Oral)   Resp 16   Ht 5' (1.524 m)   Wt 88.5 kg (195 lb)   LMP 09/19/2015   SpO2 100%   BMI 38.08 kg/m  No intake/output data recorded. No intake/output data recorded.  FHT:  155, mod variability, occas variable, Cat 2 UC: q 2 mins, lasts 60 secs  SVE:   Dilation: 7.5 Effacement (%): 90, 100 Station: 0 Exam by:: MBS  Labs: Lab Results  Component Value Date   WBC 13.2 (H) 07/02/2016   HGB 11.6 (L) 07/02/2016   HCT 34.2 (L) 07/02/2016   MCV 83.6 07/02/2016   PLT 190 07/02/2016    Assessment / Plan: A: IUP at term 2. GBS neg P: 1. Antic SVD 2. Continue to monitor UC/FHT's 3. Anesthesia called per RN to address the Lt buttock pain and epidural turned up slightly    Cassandra Erickson Cassandra Erickson 07/02/2016, 10:32 PM

## 2016-07-02 NOTE — Anesthesia Procedure Notes (Signed)
Epidural Patient location during procedure: OB Start time: 07/02/2016 5:14 PM End time: 07/02/2016 5:36 PM  Preanesthetic Checklist Completed: patient identified, site marked, surgical consent, pre-op evaluation, timeout performed, IV checked, risks and benefits discussed and monitors and equipment checked  Epidural Patient position: sitting Prep: Betadine Patient monitoring: heart rate, continuous pulse ox and blood pressure Approach: midline Location: L4-L5 Injection technique: LOR saline  Needle:  Needle type: Tuohy  Needle gauge: 17 G Needle length: 9 cm and 9 Needle insertion depth: 8 cm Catheter type: closed end flexible Catheter size: 20 Guage Catheter at skin depth: 11 cm Test dose: negative and 1.5% lidocaine with Epi 1:200 K  Assessment Events: blood not aspirated, injection not painful, no injection resistance, negative IV test and no paresthesia  Additional Notes   Patient tolerated the insertion well without complications.Reason for block:procedure for pain

## 2016-07-02 NOTE — OB Triage Note (Signed)
Pt G1P0 8244w2d complains of contractions every 10-20 minutes apart since 07/02/16 around 1100. Pt rates pain 10/10 with contractions. Pt states + FM and denies LOF. VSS.

## 2016-07-02 NOTE — Progress Notes (Addendum)
Cassandra Erickson is a 26 y.o. G1P0 at 3659w2d here for labor S/S. Pt is now comfortable with epidural.   Subjective: "I am comfortable"  Objective: BP 133/85   Pulse 97   Temp 98 F (36.7 C) (Oral)   Resp 16   Ht 5' (1.524 m)   Wt 88.5 kg (195 lb)   LMP 09/19/2015   SpO2 100%   BMI 38.08 kg/m  No intake/output data recorded. No intake/output data recorded.  FHT: 150, occas variable noted, +accels, Cat 2 UC:  q 2 mins, IUPC inserted SVE: 6/100/vtx0 Labs: Lab Results  Component Value Date   WBC 13.2 (H) 07/02/2016   HGB 11.6 (L) 07/02/2016   HCT 34.2 (L) 07/02/2016   MCV 83.6 07/02/2016   PLT 190 07/02/2016    Assessment / Plan: A; IUP at 40 2/7 weeks 2. GBS neg P;1. Continue to monitor UC/FHT's 2. Antic SVD. 3. Anticipate NSVD     Cassandra Erickson 07/02/2016, 7:49 PM

## 2016-07-02 NOTE — H&P (Signed)
Cassandra Erickson is a 26 y.o. female presenting for UC's becoming more uncomfortable today. Pt is 2 days overdue and hurting with UC's. [facility] for LMP of 09/24/15 with EDD of 07/01/15 and unplanned pregnancy. Hx of anxiety and depression.  ONG:[id number]:Anxiety and depression' Past Surgical History:  Procedure Laterality Date  . NO PAST SURGERIES     OB History    Gravida Para Term Preterm AB Living   1             SAB TAB Ectopic Multiple Live Births                 History reviewed. No pertinent past medical history. Past Surgical History:  Procedure Laterality Date  . NO PAST SURGERIES     Family History: family history includes Cancer in her maternal grandmother and paternal grandmother; Depression in her mother; Miscarriages / [place] in her mother. Social History:  reports that she has never smoked. She has never used smokeless tobacco. She reports that she does not drink alcohol or use drugs.     Maternal Diabetes: glucose Intolerance Genetic Screening:  Maternal Ultrasounds/Referrals:  Fetal Ultrasounds or other Referrals:   Maternal Substance Abuse:   Significant Maternal Medications:  Significant Maternal Lab Results:  GBs neg Other Comments:   Review of Systems  Constitutional: Negative.   HENT: Negative.   Eyes: Negative.   Respiratory: Negative.   Cardiovascular: Negative.   Gastrointestinal: Negative.   Genitourinary: Negative.   Musculoskeletal: Negative.   Skin: Negative.   Neurological: Negative.   Endo/Heme/Allergies: Negative.   Psychiatric/Behavioral: Negative.    History Dilation: 4 Effacement (%): 60, 70 Station: -2 Exam by:: C Engineer, maintenanceJohnson RN Blood pressure 130/83, pulse 79, temperature 98.2 F (36.8 C), temperature source Oral, resp. rate 20, height 5' (1.524 m), weight 88.5 kg (195 lb), last menstrual period 09/19/2015. Exam Physical Exam  Prenatal labs: ABO, Rh: --/--/A POS (07/31 [id number]) Antibody: Negative (07/21 0000) Rubella:  Immune (07/21 0000) RPR: Nonreactive (07/21 0000)  HBsAg: Negative (07/21 0000)  HIV: Non-reactive (07/21 0000)  GBS: Negative, Negative, Negative (01/24 0000)   Assessment/Plan: A: IUP at 40 2/7 weeks 2. UC's becoming stronger P: Antic SVD 2. Admit for delivery   Sharee PimpleCaron W Liandro Thelin 07/02/2016, 4:05 PM

## 2016-07-03 LAB — RPR: RPR Ser Ql: NONREACTIVE

## 2016-07-03 MED ORDER — TETANUS-DIPHTH-ACELL PERTUSSIS 5-2.5-18.5 LF-MCG/0.5 IM SUSP: 0.5000 mL | Freq: Once | INTRAMUSCULAR | Status: DC

## 2016-07-03 MED ORDER — SODIUM CHLORIDE 0.9% FLUSH: 3.0000 mL | INTRAVENOUS | Status: DC | PRN

## 2016-07-03 MED ORDER — SODIUM CHLORIDE 0.9% FLUSH: 3.0000 mL | Freq: Two times a day (BID) | INTRAVENOUS | Status: DC

## 2016-07-03 MED ORDER — ZOLPIDEM TARTRATE 5 MG PO TABS: 5.0000 mg | ORAL_TABLET | Freq: Every evening | ORAL | Status: DC | PRN

## 2016-07-03 MED ORDER — ACETAMINOPHEN 325 MG PO TABS: 650.0000 mg | ORAL_TABLET | ORAL | Status: DC | PRN

## 2016-07-03 MED ORDER — SENNOSIDES-DOCUSATE SODIUM 8.6-50 MG PO TABS
2.0000 | ORAL_TABLET | ORAL | Status: DC
  Administered 2016-07-05: 2 via ORAL
  Filled 2016-07-03 (×2): qty 2

## 2016-07-03 MED ORDER — OXYCODONE HCL 5 MG PO TABS: 5.0000 mg | ORAL_TABLET | ORAL | Status: DC | PRN

## 2016-07-03 MED ORDER — IBUPROFEN 600 MG PO TABS
600.0000 mg | ORAL_TABLET | Freq: Four times a day (QID) | ORAL | Status: DC
  Administered 2016-07-03 – 2016-07-05 (×4): 600 mg via ORAL
  Filled 2016-07-03 (×5): qty 1

## 2016-07-03 MED ORDER — BENZOCAINE-MENTHOL 20-0.5 % EX AERO
1.0000 "application " | INHALATION_SPRAY | CUTANEOUS | Status: DC | PRN
  Filled 2016-07-03 (×2): qty 56

## 2016-07-03 MED ORDER — SODIUM CHLORIDE 0.9 % IV SOLN: 250.0000 mL | INTRAVENOUS | Status: DC | PRN

## 2016-07-03 MED ORDER — SIMETHICONE 80 MG PO CHEW: 80.0000 mg | CHEWABLE_TABLET | ORAL | Status: DC | PRN

## 2016-07-03 MED ORDER — FLEET ENEMA 7-19 GM/118ML RE ENEM: 1.0000 | ENEMA | Freq: Every day | RECTAL | Status: DC | PRN

## 2016-07-03 MED ORDER — MEASLES, MUMPS & RUBELLA VAC ~~LOC~~ INJ
0.5000 mL | INJECTION | Freq: Once | SUBCUTANEOUS | Status: DC
  Filled 2016-07-03: qty 0.5

## 2016-07-03 MED ORDER — ONDANSETRON HCL 4 MG/2ML IJ SOLN: 4.0000 mg | INTRAMUSCULAR | Status: DC | PRN

## 2016-07-03 MED ORDER — DIPHENHYDRAMINE HCL 25 MG PO CAPS: 25.0000 mg | ORAL_CAPSULE | Freq: Four times a day (QID) | ORAL | Status: DC | PRN

## 2016-07-03 MED ORDER — ONDANSETRON HCL 4 MG PO TABS: 4.0000 mg | ORAL_TABLET | ORAL | Status: DC | PRN

## 2016-07-03 MED ORDER — FERROUS SULFATE 325 (65 FE) MG PO TABS
325.0000 mg | ORAL_TABLET | Freq: Two times a day (BID) | ORAL | Status: DC
  Administered 2016-07-03 – 2016-07-05 (×4): 325 mg via ORAL
  Filled 2016-07-03 (×4): qty 1

## 2016-07-03 MED ORDER — PRENATAL MULTIVITAMIN CH
1.0000 | ORAL_TABLET | Freq: Every day | ORAL | Status: DC
  Administered 2016-07-05: 1 via ORAL
  Filled 2016-07-03 (×3): qty 1

## 2016-07-03 MED ORDER — COCONUT OIL OIL: 1.0000 "application " | TOPICAL_OIL | Status: DC | PRN

## 2016-07-03 MED ORDER — BISACODYL 10 MG RE SUPP: 10.0000 mg | Freq: Every day | RECTAL | Status: DC | PRN

## 2016-07-03 MED ORDER — ROPIVACAINE HCL 2 MG/ML IJ SOLN
10.0000 mL/h | INTRAMUSCULAR | Status: DC
  Administered 2016-07-03: 10 mL/h via EPIDURAL
  Filled 2016-07-03 (×8): qty 5

## 2016-07-03 MED ORDER — WITCH HAZEL-GLYCERIN EX PADS: 1.0000 "application " | MEDICATED_PAD | CUTANEOUS | Status: DC | PRN

## 2016-07-03 MED ORDER — DIBUCAINE 1 % RE OINT: 1.0000 "application " | TOPICAL_OINTMENT | RECTAL | Status: DC | PRN

## 2016-07-03 NOTE — Progress Notes (Signed)
Cassandra Erickson is a 26 y.o. G1P0 at 3549w3d with pushing x 2 hours but, pt has not been pushing effectively. Pt states, "I can't and does not push longer than 5 secs without losing her breath".  Subjective: I cannot do this  Objective: BP (!) 136/95   Pulse (!) 102   Temp 98 F (36.7 C) (Oral)   Resp 16   Ht 5' (1.524 m)   Wt 88.5 kg (195 lb)   LMP 09/19/2015   SpO2 98%   BMI 38.08 kg/m  I/O last 3 completed shifts: In: -  Out: 250 [Urine:250] No intake/output data recorded.  FHT:140, Cat 1, +accels, no decels UC:   q 2.5 to 4 mins SVE:   Dilation: 10 Effacement (%): 100 Station: +1 Exam by:: MBS  Labs: Lab Results  Component Value Date   WBC 13.2 (H) 07/02/2016   HGB 11.6 (L) 07/02/2016   HCT 34.2 (L) 07/02/2016   MCV 83.6 07/02/2016   PLT 190 07/02/2016    Assessment / Plan: A: IUP at term 2. Prolonged pushing due to pt inability to push without giving up Baby is moving with coaching and encoragement. P: Enc to push as baby is descending with coaching. 2. Disc progress with pt and she does bring the baby down with encouragement but, otherwise only pushes for about 5 secs and then wastes the contraction.   Sharee PimpleCaron W Jones 07/03/2016, 8:23 AM

## 2016-07-03 NOTE — Discharge Summary (Signed)
Obstetric Discharge Summary Reason for Admission: early labor pattern  Prenatal Procedures:US Intrapartum Procedures: AROM, IUPC, Epidural, Continuous fetal monitoring Postpartum Procedures: None Complications-Operative and Postpartum: None Hemoglobin  Date Value Ref Range Status  07/02/2016 11.6 (L) 12.0 - 16.0 g/dL Final   HGB  Date Value Ref Range Status  04/30/2014 13.9 12.0 - 16.0 g/dL Final   HCT  Date Value Ref Range Status  07/02/2016 34.2 (L) 35.0 - 47.0 % Final  04/30/2014 42.2 35.0 - 47.0 % Final    Physical Exam:  General: alert, cooperative and appears stated age 51Lochia: appropriate Uterine Fundus: firm Incision: healing well DVT Evaluation: No evidence of DVT seen on physical exam.  Discharge Diagnoses: Term Pregnancy-delivered  Discharge Information: Date: 07/03/2016 Activity: pelvic rest Diet:Regular Medications:PNV, Fe,  Condition:Stable  Instructions:No sex x 6 weeks, Rest, Ibuprofen, No driving x 6 weeks Discharge to: Home   Newborn Data: Live born female  Birth Weight:   APGAR: Home with [name] W Jones 07/03/2016, 10:23 AM

## 2016-07-03 NOTE — Discharge Instructions (Signed)
Care After Vaginal Delivery °Congratulations on your new baby!! ° °Refer to this sheet in the next few weeks. These discharge instructions provide you with information on caring for yourself after delivery. Your caregiver may also give you specific instructions. Your treatment has been planned according to the most current medical practices available, but problems sometimes occur. Call your caregiver if you have any problems or questions after you go home. ° °HOME CARE INSTRUCTIONS °· Take over-the-counter or prescription medicines only as directed by your caregiver or pharmacist. °· Do not drink alcohol, especially if you are breastfeeding or taking medicine to relieve pain. °· Do not chew or smoke tobacco. °· Do not use illegal drugs. °· Continue to use good perineal care. Good perineal care includes: °¨ Wiping your perineum from front to back. °¨ Keeping your perineum clean. °· Do not use tampons or douche until your caregiver says it is okay. °· Shower, wash your hair, and take tub baths as directed by your caregiver. °· Wear a well-fitting bra that provides breast support. °· Eat healthy foods. °· Drink enough fluids to keep your urine clear or pale yellow. °· Eat high-fiber foods such as whole grain cereals and breads, brown rice, beans, and fresh fruits and vegetables every day. These foods may help prevent or relieve constipation. °· Follow your caregiver's recommendations regarding resumption of activities such as climbing stairs, driving, lifting, exercising, or traveling. Specifically, no driving for two weeks, so that you are comfortable reacting quickly in an emergency. °· Talk to your caregiver about resuming sexual activities. Resumption of sexual activities is dependent upon your risk of infection, your rate of healing, and your comfort and desire to resume sexual activity. Usually we recommend waiting about six weeks, or until your bleeding stops and you are interested in sex. °· Try to have someone  help you with your household activities and your newborn for at least a few days after you leave the hospital. Even longer is better. °· Rest as much as possible. Try to rest or take a nap when your newborn is sleeping. Sleep deprivation can be very hard after delivery. °· Increase your activities gradually. °· Keep all of your scheduled postpartum appointments. It is very important to keep your scheduled follow-up appointments. At these appointments, your caregiver will be checking to make sure that you are healing physically and emotionally. ° °SEEK MEDICAL CARE IF:  °· You are passing large clots from your vagina.  °· You have a foul smelling discharge from your vagina. °· You have trouble urinating. °· You are urinating frequently. °· You have pain when you urinate. °· You have a change in your bowel movements. °· You have increasing redness, pain, or swelling near your vaginal incision (episiotomy) or vaginal tear. °· You have pus draining from your episiotomy or vaginal tear. °· Your episiotomy or vaginal tear is separating. °· You have painful, hard, or reddened breasts. °· You have a severe headache. °· You have blurred vision or see spots. °· You feel sad or depressed. °· You have thoughts of hurting yourself or your newborn. °· You have questions about your care, the care of your newborn, or medicines. °· You are dizzy or light-headed. °· You have a rash. °· You have nausea or vomiting. °· You were breastfeeding and have not had a menstrual period within 12 weeks after you stopped breastfeeding. °· You are not breastfeeding and have not had a menstrual period by the 12th week after delivery. °· You   have a fever. ° °SEEK IMMEDIATE MEDICAL CARE IF:  °· You have persistent pain. °· You have chest pain. °· You have shortness of breath. °· You faint. °· You have leg pain. °· You have stomach pain. °· Your vaginal bleeding saturates two or more sanitary pads in 1 hour. ° °MAKE SURE YOU:  °· Understand these  instructions. °· Will get help right away if you are not doing well or get worse. °·  °Document Released: 04/30/2000 Document Revised: 09/17/2013 Document Reviewed: 12/29/2011 ° °[employer]® Patient Information ©2015 [employer]. This information is not intended to replace advice given to you by your health care provider. Make sure you discuss any questions you have with your health care provider. ° °

## 2016-07-03 NOTE — Progress Notes (Signed)
Cassandra Erickson is a 26 y.o. G1P0 at 7240w3d with C/C/+1 station starting to push with UC's.  Subjective: Feeling the urge to push  Objective: BP 125/80   Pulse (!) 102   Temp 98 F (36.7 C) (Oral)   Resp 16   Ht 5' (1.524 m)   Wt 88.5 kg (195 lb)   LMP 09/19/2015   SpO2 98%   BMI 38.08 kg/m  No intake/output data recorded. Total I/O In: -  Out: 250 [Urine:250]  FHT: 155, Cat 1, +accels, no decels UC:  q  1.5-2 mins SVE:   Dilation: 10 Effacement (%): 100 Station: +1 Exam by:: MBS  Labs: Lab Results  Component Value Date   WBC 13.2 (H) 07/02/2016   HGB 11.6 (L) 07/02/2016   HCT 34.2 (L) 07/02/2016   MCV 83.6 07/02/2016   PLT 190 07/02/2016    Assessment / Plan: A: IUP at term  2. GBS neg P: 1. Antic SVD 2. Will encourage to push and anticipate SVD.    Cassandra Erickson W Cassandra Erickson 07/03/2016, 5:06 AM

## 2016-07-03 NOTE — Progress Notes (Signed)
Cassandra Erickson is a 26 y.o. G1P0 at 2884w3d with cx exam of 10cms but, there is a slight lip of cx above the head that is still palpable.   Subjective: Comfortable with UC's   Objective: BP 121/68   Pulse (!) 107   Temp 98.3 F (36.8 C) (Oral)   Resp 16   Ht 5' (1.524 m)   Wt 88.5 kg (195 lb)   LMP 09/19/2015   SpO2 98%   BMI 38.08 kg/m  No intake/output data recorded. Total I/O In: -  Out: 250 [Urine:250]  FHT:  140, Cat 1, + accels, no decels UC:  q 3-4 mins SVE:   10/100/Ant lip still above the head and not cleared 100% Labs: Lab Results  Component Value Date   WBC 13.2 (H) 07/02/2016   HGB 11.6 (L) 07/02/2016   HCT 34.2 (L) 07/02/2016   MCV 83.6 07/02/2016   PLT 190 07/02/2016    Assessment / Plan: A: IUP at term 2. Cervical progress laboring baby down P: 1. Will labor baby down for now. Pt is comfortable and does not feel the urge to push. 2. Will start Pitocin if needed. 3. Continue to monitor UC/FHT's.   Cassandra Erickson 07/03/2016, 2:48 AM

## 2016-07-03 NOTE — Progress Notes (Signed)
Bari MantisCourtney N Larrick is a 26 y.o. G1P0 at 5171w3d with labor pattern and 9.5 cms per last cx exam.   Subjective: No complaints  Objective: BP 121/68   Pulse (!) 107   Temp 98.3 F (36.8 C) (Oral)   Resp 16   Ht 5' (1.524 m)   Wt 88.5 kg (195 lb)   LMP 09/19/2015   SpO2 98%   BMI 38.08 kg/m  No intake/output data recorded. Total I/O In: -  Out: 250 [Urine:250]  FHT:155, mod variability, Cat 1, +accels, no decels UC:   q 3-364mins SVE:   Dilation: Lip/rim Effacement (%): 100 Station: 0 Exam by:: MBS  Labs: Lab Results  Component Value Date   WBC 13.2 (H) 07/02/2016   HGB 11.6 (L) 07/02/2016   HCT 34.2 (L) 07/02/2016   MCV 83.6 07/02/2016   PLT 190 07/02/2016    Assessment / Plan: A: IUP at term with cx progress P: Antic SVD. Labor down at present   [name] W Jones 07/03/2016, 2:17 AM

## 2016-07-04 ENCOUNTER — Encounter: Payer: Self-pay | Admitting: *Deleted

## 2016-07-04 LAB — CBC
HCT: 31.3 % — ABNORMAL LOW (ref 35.0–47.0)
Hemoglobin: 10.2 g/dL — ABNORMAL LOW (ref 12.0–16.0)
MCH: 28 pg (ref 26.0–34.0)
MCHC: 32.7 g/dL (ref 32.0–36.0)
MCV: 85.6 fL (ref 80.0–100.0)
Platelets: 164 10*3/uL (ref 150–440)
RBC: 3.65 MIL/uL — ABNORMAL LOW (ref 3.80–5.20)
RDW: 15.9 % — ABNORMAL HIGH (ref 11.5–14.5)
WBC: 16.5 10*3/uL — ABNORMAL HIGH (ref 3.6–11.0)

## 2016-07-04 MED ORDER — INFLUENZA VAC SPLIT QUAD 0.5 ML IM SUSY: 0.5000 mL | PREFILLED_SYRINGE | INTRAMUSCULAR | Status: DC | PRN

## 2016-07-04 NOTE — Anesthesia Postprocedure Evaluation (Signed)
Anesthesia Post Note  Patient: Cassandra Erickson  Procedure(s) Performed: * No procedures listed *  Patient location during evaluation: Mother Baby Anesthesia Type: Epidural Level of consciousness: awake and alert Pain management: pain level controlled Vital Signs Assessment: post-procedure vital signs reviewed and stable Respiratory status: spontaneous breathing, nonlabored ventilation and respiratory function stable Cardiovascular status: stable Postop Assessment: no headache, no backache and epidural receding Anesthetic complications: no     Last Vitals:  Vitals:   07/04/16 0727 07/04/16 1159  BP: 97/67   Pulse: 87   Resp: 16   Temp: 36.7 C 36.4 C    Last Pain:  Vitals:   07/04/16 1159  TempSrc: Oral  PainSc:                  Christyn Gutkowski S

## 2016-07-04 NOTE — Lactation Note (Signed)
This note was copied from a baby's chart. Mom's breasts are full, warm to touch and extremely uncomfortable per mom.  Mom has no desire to breastfeed or pump breasts.  Information given on drying up breast milk.  Well fitting supportive bra applied.  Discouraged warmth or massaging.  Cabbage leaves acquired from dietary and applied to breasts until comfortable and last layer left in bra.  Ice packs were applied on top of cabbage leaves.  Mom reports feeling much better.

## 2016-07-04 NOTE — Progress Notes (Addendum)
Post Partum Day 1 Subjective: no complaints, up ad lib, voiding and tolerating PO  Objective: Blood pressure 97/67, pulse 87, temperature 98.1 F (36.7 C), temperature source Oral, resp. rate 16, height 5' (1.524 m), weight 88.5 kg (195 lb), last menstrual period 09/19/2015, SpO2 99 %, unknown if currently breastfeeding.  Physical Exam:  General: alert, cooperative and appears stated age  Heart: S1S2, RRR, No M/R/G Lungs: CTA bilat, no W/R/R. Lochia: appropriate Uterine Fundus: firm, U-1 Incision: healing well DVT Evaluation: No evidence of DVT seen on physical exam.   Recent Labs  07/02/16 1556 07/04/16 0504  HGB 11.6* 10.2*  HCT 34.2* 31.3*    Assessment/Plan: Plan for discharge tomorrow   LOS: 2 days   [name] W Jones 07/04/2016, 9:47 AM

## 2016-07-05 MED ORDER — IBUPROFEN 200 MG PO TABS: 800.0000 mg | ORAL_TABLET | Freq: Three times a day (TID) | ORAL | 0 refills | Status: DC | PRN

## 2016-07-05 NOTE — Discharge Summary (Signed)
Obstetric Discharge Summary Reason for Admission: onset of labor Prenatal Procedures: none Intrapartum Procedures: spontaneous vaginal delivery Postpartum Procedures: none Complications-Operative and Postpartum: none Hemoglobin  Date Value Ref Range Status  07/04/2016 10.2 (L) 12.0 - 16.0 g/dL Final   HGB  Date Value Ref Range Status  04/30/2014 13.9 12.0 - 16.0 g/dL Final   HCT  Date Value Ref Range Status  07/04/2016 31.3 (L) 35.0 - 47.0 % Final  04/30/2014 42.2 35.0 - 47.0 % Final    Physical Exam:  General: alert and cooperative Lochia: appropriate Uterine Fundus: firm Incision: n/a DVT Evaluation: No evidence of DVT seen on physical exam.  Discharge Diagnoses: Term Pregnancy-delivered  Discharge Information: Date: 07/05/2016 Activity: pelvic rest Diet: routine Medications: Ibuprofen Condition: stable Instructions: refer to practice specific booklet Discharge to: home Follow-up Information    Jean RosenthalJackson Step Milon ScoreJones Caron W Cnm .        [name] W Jones, CNM Follow up in 6 week(s).   Specialty:  Obstetrics and Gynecology Why:  pp exam and Nexplanon placement  Contact information: [address] [facility] [place] [address] [phone]         Pt desires Nexplanon at her 6 week appt   Newborn Data: Live born female  Birth Weight: 7 lb 9.3 oz (3440 g) APGAR: 5, 7  Home with mother.  SCHERMERHORN,THOMAS 07/05/2016, 9:27 AM

## 2016-07-08 ENCOUNTER — Ambulatory Visit
Admission: EM | Admit: 2016-07-08 | Discharge: 2016-07-08 | Disposition: A | Discharge status: Home / Self Care | Payer: Managed Care, Other (non HMO) | Attending: Family Medicine | Admitting: Family Medicine

## 2016-07-08 DIAGNOSIS — J0120 Acute ethmoidal sinusitis, unspecified: Secondary | ICD-10-CM

## 2016-07-08 MED ORDER — AMOXICILLIN-POT CLAVULANATE 875-125 MG PO TABS: 1.0000 | ORAL_TABLET | Freq: Two times a day (BID) | ORAL | 0 refills | Status: AC

## 2016-07-08 NOTE — ED Triage Notes (Signed)
Patient complains of cough, congestion, runny nose, right ear ache, and nose bleeds. Patient states that symptoms started a couple weeks ago but she just had a baby 5 days ago.

## 2016-07-08 NOTE — ED Provider Notes (Signed)
CSN: [id number]     Arrival date & time 07/08/16  1103 History   First MD Initiated Contact with Patient 07/08/16 1157     Chief Complaint  Patient presents with  . Cough   (Consider location/radiation/quality/duration/timing/severity/associated sxs/prior Treatment) 26 year old female presents with nasal congestion, right ear pain, nose bleeds, and coughing that started 2-3 weeks ago. Also experiencing some nausea but no fever, vomiting or diarrhea. She is 5 days post-partum with a normal vaginal delivery. She has had symptoms while she was pregnant but did not take any medication before delivery. She is not breast-feeding and only on Prenatal vitamins.    The history is provided by the patient.    History reviewed. No pertinent past medical history. Past Surgical History:  Procedure Laterality Date  . NO PAST SURGERIES     Family History  Problem Relation Age of Onset  . Depression Mother   . Miscarriages / [place] Mother   . Cancer Maternal Grandmother   . Cancer Paternal Grandmother    Social History  Substance Use Topics  . Smoking status: Never Smoker  . Smokeless tobacco: Never Used  . Alcohol use No   OB History    Gravida Para Term Preterm AB Living   1             SAB TAB Ectopic Multiple Live Births                 Review of Systems  Constitutional: Positive for fatigue. Negative for appetite change, chills and fever.  HENT: Positive for congestion, ear pain, nosebleeds, rhinorrhea, sinus pain and sinus pressure. Negative for ear discharge, sneezing and sore throat.   Eyes: Negative for discharge.  Respiratory: Positive for cough. Negative for chest tightness, shortness of breath and wheezing.   Gastrointestinal: Positive for nausea. Negative for diarrhea and vomiting.  Musculoskeletal: Negative for arthralgias, myalgias and neck pain.  Skin: Negative for rash.  Neurological: Positive for headaches. Negative for dizziness, syncope, weakness and  light-headedness.  Hematological: Negative for adenopathy.    Allergies  Patient has no known allergies.  Home Medications   Prior to Admission medications   Medication Sig Start Date End Date Taking? Authorizing Provider  ibuprofen (MOTRIN IB) 200 MG tablet Take 4 tablets (800 mg total) by mouth every 8 (eight) hours as needed. 07/05/16  Yes Ihor Austinhomas J Schermerhorn, MD  Prenatal Vit-Fe Fumarate-FA (PRENATAL MULTIVITAMIN) TABS tablet Take 1 tablet by mouth daily at 12 noon.   Yes Historical Provider, MD  amoxicillin-clavulanate (AUGMENTIN) 875-125 MG tablet Take 1 tablet by mouth every 12 (twelve) hours. 07/08/16 07/15/16  Sudie GrumblingAnn Berry Jarrius [place], NP   Meds Ordered and Administered this Visit  Medications - No data to display  BP 123/67 (BP Location: Left Arm)   Pulse 79   Temp 98.1 F (36.7 C) (Oral)   Resp 17   Ht 5' (1.524 m)   Wt 180 lb (81.6 kg)   SpO2 97%   Breastfeeding? No   BMI 35.15 kg/m  No data found.   Physical Exam  Constitutional: She is oriented to person, place, and time. She appears well-developed and well-nourished. No distress.  HENT:  Head: Normocephalic and atraumatic.  Right Ear: Hearing and external ear normal. A middle ear effusion is present.  Left Ear: Hearing, tympanic membrane, external ear and ear canal normal.  Nose: Mucosal edema and rhinorrhea present. Right sinus exhibits maxillary sinus tenderness (more ethymoid). Right sinus exhibits no frontal sinus tenderness. Left sinus  exhibits maxillary sinus tenderness. Left sinus exhibits no frontal sinus tenderness.  Mouth/Throat: Uvula is midline and mucous membranes are normal. Oropharyngeal exudate (slight yellow drainage present) and posterior oropharyngeal erythema present.  Right ear canal with soft yellow cerumen but not completely blocking TM.   Neck: Normal range of motion. Neck supple.  Cardiovascular: Normal rate, regular rhythm and normal heart sounds.   Pulmonary/Chest: Effort normal and breath  sounds normal. No respiratory distress. She has no decreased breath sounds. She has no wheezes. She has no rhonchi.  Lymphadenopathy:    She has no cervical adenopathy.  Neurological: She is alert and oriented to person, place, and time.  Skin: Skin is warm and dry. Capillary refill takes less than 2 seconds.  Psychiatric: She has a normal mood and affect. Her behavior is normal. Judgment and thought content normal.    Urgent Care Course     Procedures (including critical care time)  Labs Review Labs Reviewed - No data to display  Imaging Review No results found.   Visual Acuity Review  Right Eye Distance:   Left Eye Distance:   Bilateral Distance:    Right Eye Near:   Left Eye Near:    Bilateral Near:         MDM   1. Acute non-recurrent ethmoidal sinusitis    Recommend start Augmentin 875mg  twice a day as directed. May take OTC Sudafed as needed for congestion. Increase fluid intake to help loosen mucus. Follow-up with a primary care provider in 4 to 5 days if not improving.     Sudie Grumbling, NP 07/08/16 [phone]

## 2016-07-08 NOTE — Discharge Instructions (Signed)
Recommend start Augmentin 875mg  twice a day as directed. May take OTC Sudafed as needed for congestion. Increase fluid intake to help loosen mucus. Follow-up with a primary care provider in 4 to 5 days if not improving.

## 2016-09-05 ENCOUNTER — Encounter: Payer: Self-pay | Admitting: *Deleted

## 2016-09-05 ENCOUNTER — Ambulatory Visit
Admission: EM | Admit: 2016-09-05 | Discharge: 2016-09-05 | Disposition: A | Discharge status: Home / Self Care | Payer: Medicaid Other | Attending: Family Medicine | Admitting: Family Medicine

## 2016-09-05 DIAGNOSIS — R112 Nausea with vomiting, unspecified: Secondary | ICD-10-CM

## 2016-09-05 DIAGNOSIS — K529 Noninfective gastroenteritis and colitis, unspecified: Secondary | ICD-10-CM

## 2016-09-05 DIAGNOSIS — R197 Diarrhea, unspecified: Secondary | ICD-10-CM

## 2016-09-05 LAB — BASIC METABOLIC PANEL
Anion gap: 8 (ref 5–15)
BUN: 9 mg/dL (ref 6–20)
CO2: 23 mmol/L (ref 22–32)
Calcium: 9 mg/dL (ref 8.9–10.3)
Chloride: 106 mmol/L (ref 101–111)
Creatinine, Ser: 0.67 mg/dL (ref 0.44–1.00)
GFR calc Af Amer: >60 mL/min (ref >60)
GFR calc non Af Amer: >60 mL/min (ref >60)
Glucose, Bld: 100 mg/dL — ABNORMAL HIGH (ref 65–99)
Potassium: 3.6 mmol/L (ref 3.5–5.1)
Sodium: 137 mmol/L (ref 135–145)

## 2016-09-05 MED ORDER — ONDANSETRON 8 MG PO TBDP: 8.0000 mg | ORAL_TABLET | Freq: Three times a day (TID) | ORAL | 0 refills | Status: DC | PRN

## 2016-09-05 MED ORDER — ONDANSETRON 8 MG PO TBDP
8.0000 mg | ORAL_TABLET | Freq: Once | ORAL | Status: AC
  Administered 2016-09-05: 8 mg via ORAL

## 2016-09-05 NOTE — ED Provider Notes (Signed)
[facility]    CSN: [id number] Arrival date & time: 09/05/16  0806     History   Chief Complaint Chief Complaint  Patient presents with  . Emesis  . Nausea    HPI Cassandra Erickson is a 26 y.o. female.   The history is provided by the patient.  Emesis  Severity:  Moderate Duration:  7 days Timing:  Constant Number of daily episodes:  4 Quality:  Stomach contents and undigested food Able to tolerate:  Liquids Progression:  Unchanged Chronicity:  New Recent urination:  Normal Relieved by:  None tried Exacerbated by: food; patient has continued eating a regular diet. Ineffective treatments:  None tried Associated symptoms: chills and diarrhea   Associated symptoms: no abdominal pain, no arthralgias, no cough, no fever, no headaches, no myalgias, no sore throat and no URI   Risk factors: sick contacts   Risk factors: no alcohol use, no diabetes, not pregnant, no prior abdominal surgery, no suspect food intake and no travel to endemic areas     History reviewed. No pertinent past medical history.  Patient Active Problem List   Diagnosis Date Noted  . Indication for care in labor or delivery 07/02/2016  . Vaginal bleeding 03/08/2016  . Vaginal bleeding in pregnancy, second trimester 03/08/2016  . First trimester screening 12/29/2015    Past Surgical History:  Procedure Laterality Date  . NO PAST SURGERIES      OB History    Gravida Para Term Preterm AB Living   1             SAB TAB Ectopic Multiple Live Births                   Home Medications    Prior to Admission medications   Medication Sig Start Date End Date Taking? Authorizing Provider  ibuprofen (MOTRIN IB) 200 MG tablet Take 4 tablets (800 mg total) by mouth every 8 (eight) hours as needed. 07/05/16   Ihor Austin Schermerhorn, MD  ondansetron (ZOFRAN ODT) 8 MG disintegrating tablet Take 1 tablet (8 mg total) by mouth every 8 (eight) hours as needed. 09/05/16   Payton Mccallum, MD    Prenatal Vit-Fe Fumarate-FA (PRENATAL MULTIVITAMIN) TABS tablet Take 1 tablet by mouth daily at 12 noon.    Historical Provider, MD    Family History Family History  Problem Relation Age of Onset  . Depression Mother   . Miscarriages / [place] Mother   . Cancer Maternal Grandmother   . Cancer Paternal Grandmother     Social History Social History  Substance Use Topics  . Smoking status: Former Games developer  . Smokeless tobacco: Never Used  . Alcohol use No     Allergies   Patient has no known allergies.   Review of Systems Review of Systems  Constitutional: Positive for chills. Negative for fever.  HENT: Negative for sore throat.   Respiratory: Negative for cough.   Gastrointestinal: Positive for diarrhea and vomiting. Negative for abdominal pain.  Musculoskeletal: Negative for arthralgias and myalgias.  Neurological: Negative for headaches.     Physical Exam Triage Vital Signs ED Triage Vitals  Enc Vitals Group     BP 09/05/16 0820 118/79     Pulse Rate 09/05/16 0820 79     Resp 09/05/16 0820 16     Temp 09/05/16 0820 98.1 F (36.7 C)     Temp Source 09/05/16 0820 Oral     SpO2 09/05/16 0820 100 %  Weight --      Height --      Head Circumference --      Peak Flow --      Pain Score 09/05/16 0822 8     Pain Loc --      Pain Edu? --      Excl. in GC? --    No data found.   Updated Vital Signs BP 118/79 (BP Location: Left Arm)   Pulse 79   Temp 98.1 F (36.7 C) (Oral)   Resp 16   LMP 09/05/2016   SpO2 100%   Visual Acuity Right Eye Distance:   Left Eye Distance:   Bilateral Distance:    Right Eye Near:   Left Eye Near:    Bilateral Near:     Physical Exam  Constitutional: She appears well-developed and well-nourished. No distress.  Abdominal: Soft. Bowel sounds are normal. She exhibits no distension and no mass. There is tenderness (mild, diffuse; no rebound or guarding). There is no rebound and no guarding.  Skin: She is not  diaphoretic.  Nursing note and vitals reviewed.    UC Treatments / Results  Labs (all labs ordered are listed, but only abnormal results are displayed) Labs Reviewed  BASIC METABOLIC PANEL - Abnormal; Notable for the following:       Result Value   Glucose, Bld 100 (*)    All other components within normal limits    EKG  EKG Interpretation None       Radiology No results found.  Procedures Procedures (including critical care time)  Medications Ordered in UC Medications  ondansetron (ZOFRAN-ODT) disintegrating tablet 8 mg (8 mg Oral Given 09/05/16 0850)     Initial Impression / Assessment and Plan / UC Course  I have reviewed the triage vital signs and the nursing notes.  Pertinent labs & imaging results that were available during my care of the patient were reviewed by me and considered in my medical decision making (see chart for details).      Final Clinical Impressions(s) / UC Diagnoses   Final diagnoses:  Nausea vomiting and diarrhea  Gastroenteritis    New Prescriptions New Prescriptions   ONDANSETRON (ZOFRAN ODT) 8 MG DISINTEGRATING TABLET    Take 1 tablet (8 mg total) by mouth every 8 (eight) hours as needed.   1. Lab results and diagnosis reviewed with patient 2. Patient given zofran  odt with improvement of symptoms and tolerating po fluids prior to discharge 3. rx as per orders above; reviewed possible side effects, interactions, risks and benefits  4. Recommend supportive treatment with clear liquids then advance diet slowly as tolerated; otc imodium AD prn diarrhea 5. Follow-up prn if symptoms worsen or don't improve   Payton Mccallum, MD 09/05/16 [phone]

## 2016-09-05 NOTE — ED Triage Notes (Signed)
Patient started having symptoms of diarrhea, nausea, and vomiting 1 week ago. No other symptoms reported.

## 2016-09-05 NOTE — Discharge Instructions (Signed)
Recommend over the counter Imodium AD for diarrhea  Clear liquids (water, gatorade, pedialyte, broth) for 24 hours then advance SLOWLY to bland diet and eventually regular diet over 2-3 days

## 2017-04-11 IMAGING — CT CT HEAD W/O CM
3 series · 15 of 43 positions shown, 18 images · non-contrast
Comparison: None.

CLINICAL DATA: 25-year-old female with a history of headache

EXAM:
CT HEAD WITHOUT CONTRAST
TECHNIQUE: Contiguous axial images were obtained from the base of the skull
through the vertex without intravenous contrast.

[Series 2: head wo · axial · 0.47mm/px · z∈[-184,-79]mm · 9 of 26 slices shown, 12 images]
[im 3/26  brain]
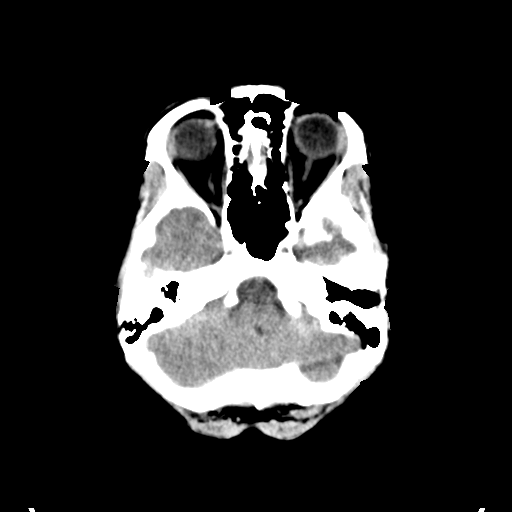
[im 3/26  bone]
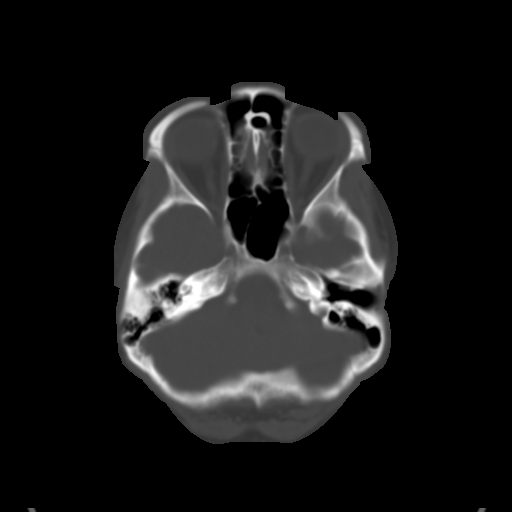
[im 6/26  brain]
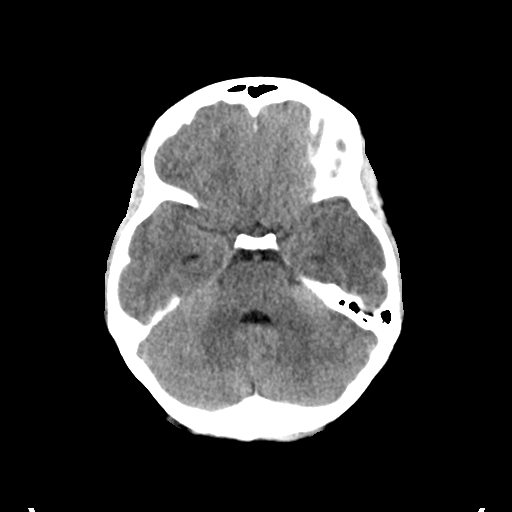
[im 8/26  brain]
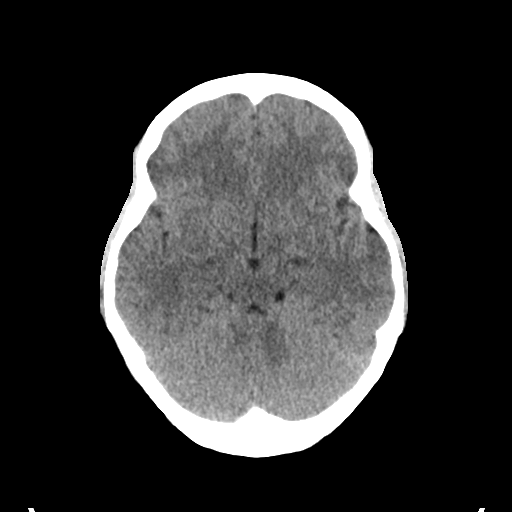
[im 11/26  brain]
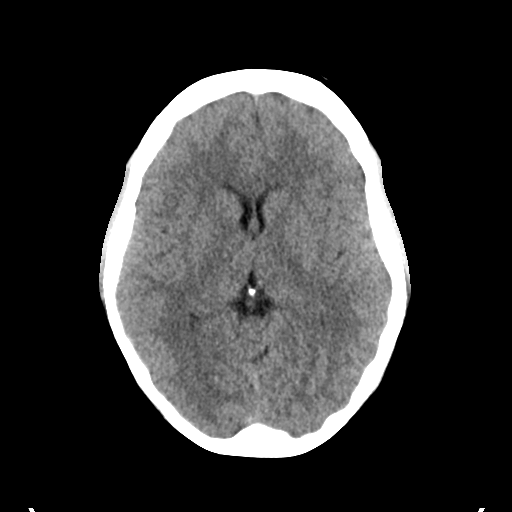
[im 14/26  brain]
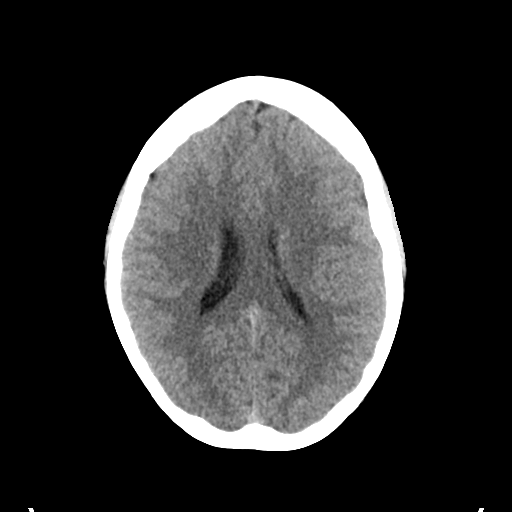
[im 14/26  bone]
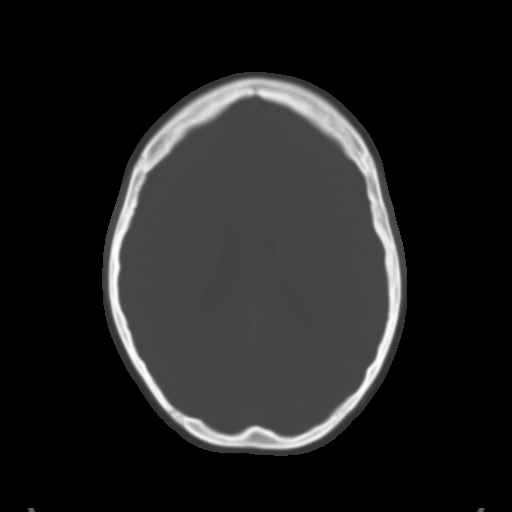
[im 16/26  brain]
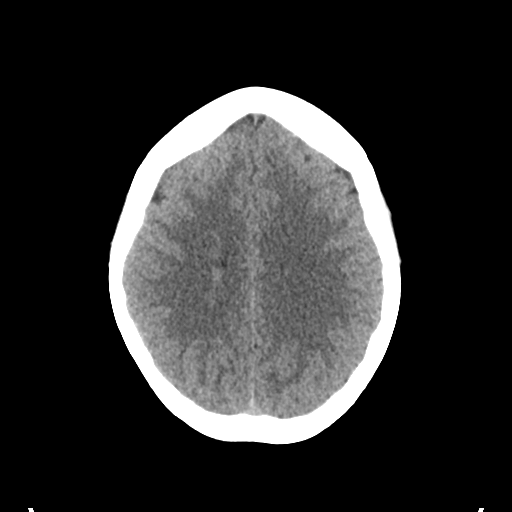
[im 19/26  brain]
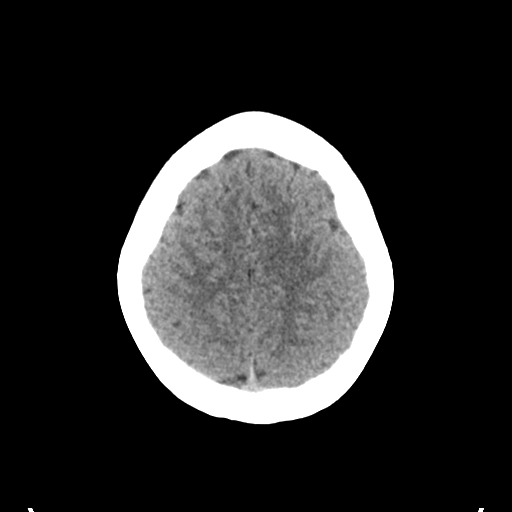
[im 22/26  brain]
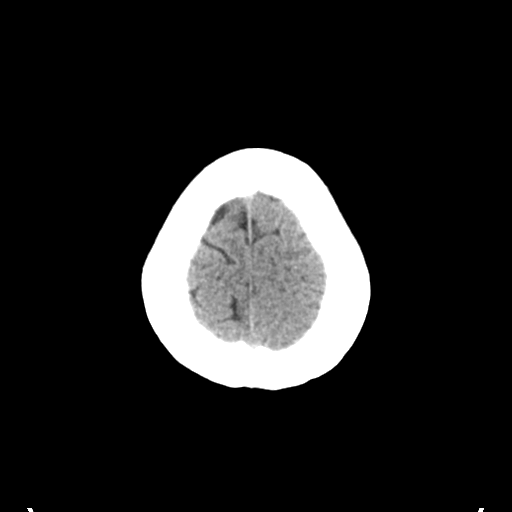
[im 24/26  brain]
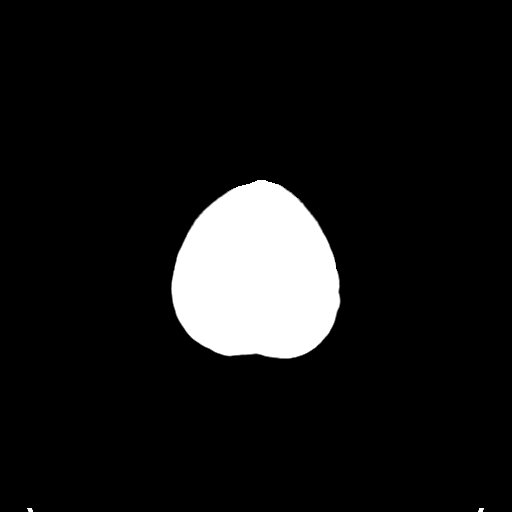
[im 24/26  bone]
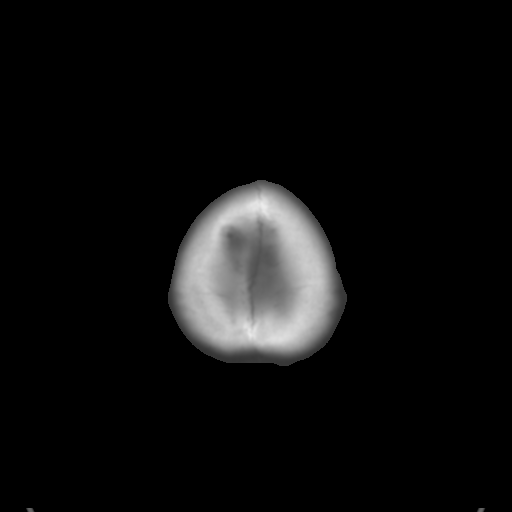

[Series 4: coronal soft tissue · coronal · 0.25mm/px · 3 of 61 slices shown]
[im 21/61  brain]
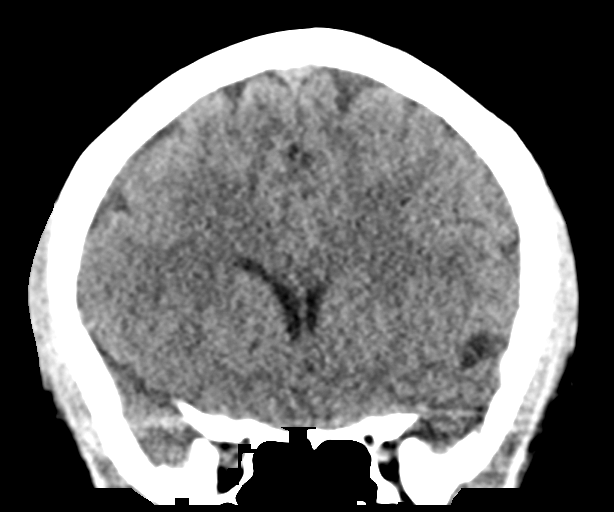
[im 27/61  brain]
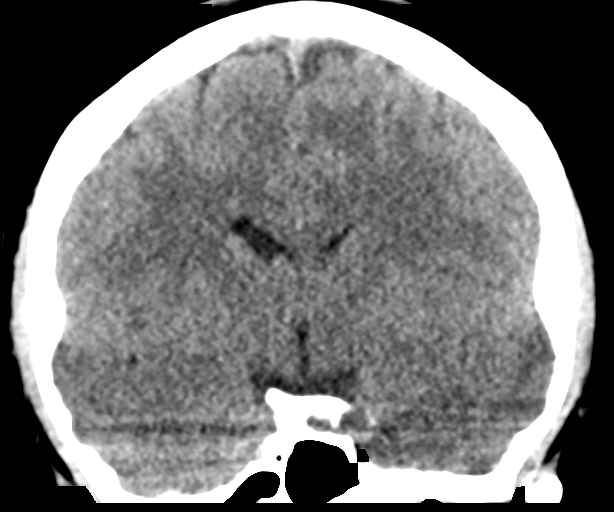
[im 34/61  brain]
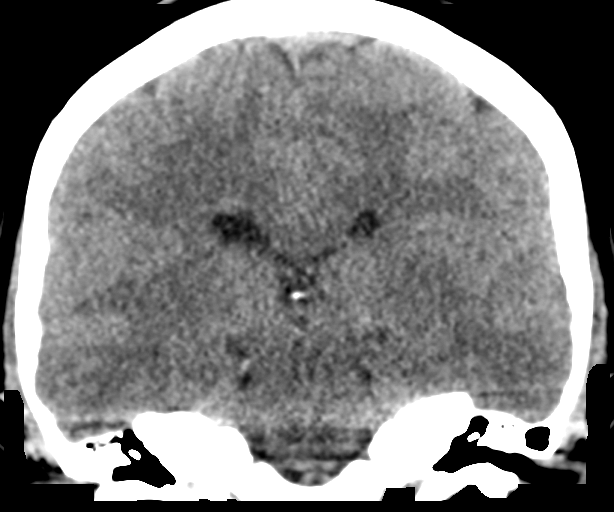

[Series 5: sagittal soft tissue · sagittal · 0.25mm/px · 3 of 51 slices shown]
[im 17/51  brain]
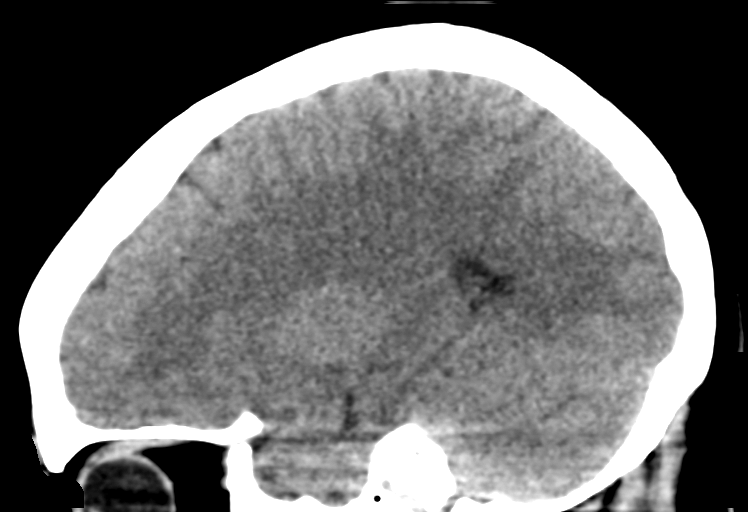
[im 26/51  brain]
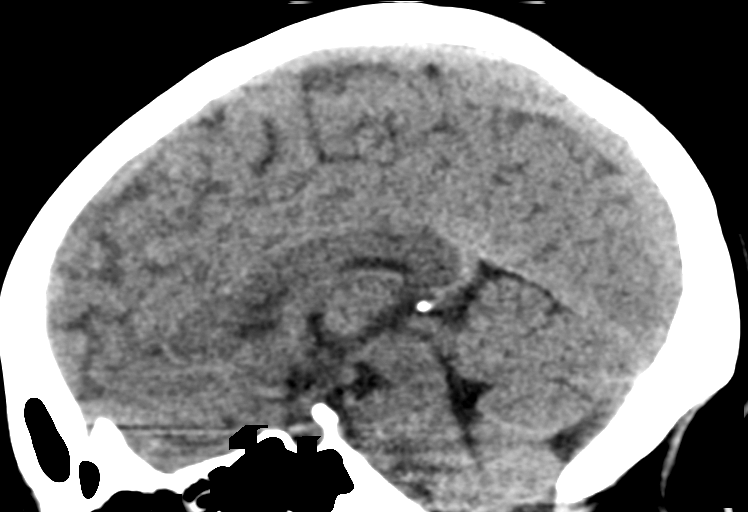
[im 34/51  brain]
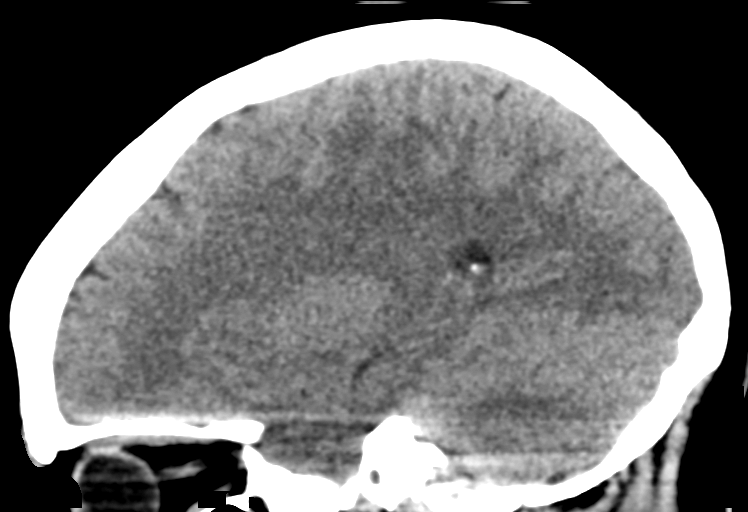

[15 of 43 positions shown; findings below may reference images not displayed]

FINDINGS: Unremarkable appearance of the calvarium without acute fracture or
aggressive lesion.

Unremarkable appearance of the scalp soft tissues.

Unremarkable appearance of the bilateral orbits.

Mastoid air cells are clear.

No significant paranasal sinus disease

No acute intracranial hemorrhage, midline shift, or mass effect.

Gray-white differentiation is maintained, without CT evidence of
acute ischemia.

Unremarkable configuration of the ventricles.
IMPRESSION: No CT evidence of acute intracranial abnormality.

## 2017-05-13 ENCOUNTER — Other Ambulatory Visit: Payer: Self-pay

## 2017-05-13 ENCOUNTER — Encounter: Payer: Self-pay | Admitting: *Deleted

## 2017-05-13 ENCOUNTER — Ambulatory Visit
Admission: EM | Admit: 2017-05-13 | Discharge: 2017-05-13 | Disposition: A | Discharge status: Home / Self Care | Payer: Medicaid Other | Attending: Family Medicine | Admitting: Family Medicine

## 2017-05-13 DIAGNOSIS — R112 Nausea with vomiting, unspecified: Secondary | ICD-10-CM | POA: Diagnosis not present

## 2017-05-13 DIAGNOSIS — B850 Pediculosis due to Pediculus humanus capitis: Secondary | ICD-10-CM | POA: Diagnosis not present

## 2017-05-13 MED ORDER — PERMETHRIN 5 % EX CREA: 1.0000 "application " | TOPICAL_CREAM | Freq: Once | CUTANEOUS | 1 refills | Status: DC

## 2017-05-13 MED ORDER — PERMETHRIN 5 % EX CREA: 1.0000 "application " | TOPICAL_CREAM | Freq: Once | CUTANEOUS | 1 refills | Status: AC

## 2017-05-13 MED ORDER — ONDANSETRON 4 MG PO TBDP: 4.0000 mg | ORAL_TABLET | Freq: Three times a day (TID) | ORAL | 0 refills | Status: DC | PRN

## 2017-05-13 NOTE — ED Provider Notes (Signed)
[facility] ____________________________________________  Time seen: Approximately 1055 AM  I have reviewed the triage vital signs and the nursing notes.   HISTORY  Chief Complaint Emesis and Nausea   HPI Cassandra Erickson is a 26 y.o. female pending for evaluation of nausea and vomiting for the last symptoms started.  Reports approximately 3 episodes of vomiting yesterday with continued nausea.  No vomiting today.  Denies associated diarrhea.  Reports continues to have normal bowel movements.  Denies known food trigger.  Patient reports that her mother, other family members and her daughter recently was sick with similar complaints.  States that she tried to take Pepto yesterday but vomited that.  Patient states that she has some generalized abdominal cramping discomfort, denies point abdominal pain.  Denies associated fevers, dysuria, vaginal complaints, syncope, near syncope, abnormal colored vomit.  Reports otherwise feels well.  States overall today she is feeling better, states that she had a caudal work.  States did tolerate some fluids and chicken noodle soup this morning.  Denies other aggravating or relieving factors.  Reports otherwise feels well.   Patient's last menstrual period was 04/15/2017.  Denies chance of pregnancy.    History reviewed. No pertinent past medical history.  Patient Active Problem List   Diagnosis Date Noted  . Indication for care in labor or delivery 07/02/2016  . Vaginal bleeding 03/08/2016  . Vaginal bleeding in pregnancy, second trimester 03/08/2016  . First trimester screening 12/29/2015    Past Surgical History:  Procedure Laterality Date  . NO PAST SURGERIES       No current facility-administered medications for this encounter.   Current Outpatient Medications:  .  norethindrone-ethinyl estradiol-iron (ESTROSTEP FE,TILIA FE,TRI-LEGEST FE) 1-20/1-30/1-35 MG-MCG tablet, Take 1 tablet by mouth daily., Disp: , Rfl:  .   ondansetron (ZOFRAN ODT) 4 MG disintegrating tablet, Take 1 tablet (4 mg total) by mouth every 8 (eight) hours as needed for nausea or vomiting., Disp: 15 tablet, Rfl: 0 .  Prenatal Vit-Fe Fumarate-FA (PRENATAL MULTIVITAMIN) TABS tablet, Take 1 tablet by mouth daily at 12 noon., Disp: , Rfl:   Allergies Patient has no known allergies.  Family History  Problem Relation Age of Onset  . Depression Mother   . Miscarriages / [place] Mother   . Cancer Maternal Grandmother   . Cancer Paternal Grandmother     Social History Social History   Tobacco Use  . Smoking status: Former Games developermoker  . Smokeless tobacco: Never Used  Substance Use Topics  . Alcohol use: No  . Drug use: No    Review of Systems Constitutional: No fever/chills. Cardiovascular: Denies chest pain. Respiratory: Denies shortness of breath. Gastrointestinal:As above.  Genitourinary: Negative for dysuria. Musculoskeletal: Negative for back pain. Skin: Negative for rash.  ____________________________________________   PHYSICAL EXAM:  VITAL SIGNS: ED Triage Vitals  Enc Vitals Group     BP 05/13/17 1026 130/79     Pulse Rate 05/13/17 1026 86     Resp 05/13/17 1026 16     Temp 05/13/17 1026 98.8 F (37.1 C)     Temp Source 05/13/17 1026 Oral     SpO2 05/13/17 1026 100 %     Weight 05/13/17 1026 160 lb (72.6 kg)     Height 05/13/17 1026 5' (1.524 m)     Head Circumference --      Peak Flow --      Pain Score 05/13/17 1025 0     Pain Loc --  Pain Edu? --      Excl. in GC? --     Constitutional: Alert and oriented. Well appearing and in no acute distress. ENT      Head: Normocephalic and atraumatic.      Nose: No congestion/rhinnorhea.      Mouth/Throat: Mucous membranes are moist.Oropharynx non-erythematous. Cardiovascular: Normal rate, regular rhythm. Grossly normal heart sounds.  Good peripheral circulation. Respiratory: Normal respiratory effort without tachypnea nor retractions. Breath sounds  are clear and equal bilaterally. No wheezes, rales, rhonchi. Gastrointestinal: Soft and nontender. No distention. Normal Bowel sounds. No CVA tenderness. Musculoskeletal: Steady gait.  Neurologic:  Normal speech and language.Speech is normal. No gait instability.  Skin:  Skin is warm, dry and intact. No rash noted. Psychiatric: Mood and affect are normal. Speech and behavior are normal. Patient exhibits appropriate insight and judgment   ___________________________________________   LABS (all labs ordered are listed, but only abnormal results are displayed)  Labs Reviewed - No data to display  RADIOLOGY  No results found. ____________________________________________   PROCEDURES Procedures    INITIAL IMPRESSION / ASSESSMENT AND PLAN / ED COURSE  Pertinent labs & imaging results that were available during my care of the patient were reviewed by me and considered in my medical decision making (see chart for details).  Well-appearing patient.  No acute distress.  Suspect viral gastroenteritis.  Will treat supportively with as needed Zofran.  Patient denies need for Zofran dose prior to leaving.  Work note given for today and tomorrow as patient works in [employer].  Encourage rest, fluids, supportive care.Discussed indication, risks and benefits of medications with patient.  Discussed follow up with Primary care physician this week. Discussed follow up and return parameters including no resolution or any worsening concerns. Patient verbalized understanding and agreed to plan.   ____________________________________________   FINAL CLINICAL IMPRESSION(S) / ED DIAGNOSES  Final diagnoses:  Non-intractable vomiting with nausea, unspecified vomiting type     ED Discharge Orders        Ordered    ondansetron (ZOFRAN ODT) 4 MG disintegrating tablet  Every 8 hours PRN     05/13/17 1056       Note: This dictation was prepared with Dragon dictation along with smaller phrase  technology. Any transcriptional errors that result from this process are unintentional.         Renford DillsMiller, Sueo Cullen, NP 05/13/17 1320

## 2017-05-13 NOTE — ED Triage Notes (Signed)
Head lice infestation x1 month. OTC meds not working.  

## 2017-05-13 NOTE — ED Triage Notes (Signed)
Patient started having symptoms of nausea and vomiting yesterday.

## 2017-05-13 NOTE — ED Provider Notes (Signed)
[facility] ____________________________________________  Time seen: Approximately 8:00 PM  I have reviewed the triage vital signs and the nursing notes.   HISTORY  Chief Complaint Head Lice   HPI Bari MantisCourtney N Worthy is a 26 y.o. female presented for evaluation of head lice infestation that is been present for 1 months.  Patient was seen in urgent care earlier for vomiting, and states did not mention head lice component as she was awaiting to bring stepdaughter with her for evaluation 2.  Patient reports has not had any other vomiting and still feeling better.  Reports she has had head lice for approximately 1 month.  Unsure if she and stepfather passing it back and forth.  States has tried over-the-counter treatments with last being sometime last week.  States she continues to have itching to her head.  Denies pain.  Denies other aggravating or relieving  Patient's last menstrual period was 04/15/2017.Denies pregnancy.   History reviewed. No pertinent past medical history.  Patient Active Problem List   Diagnosis Date Noted  . Indication for care in labor or delivery 07/02/2016  . Vaginal bleeding 03/08/2016  . Vaginal bleeding in pregnancy, second trimester 03/08/2016  . First trimester screening 12/29/2015    Past Surgical History:  Procedure Laterality Date  . NO PAST SURGERIES       No current facility-administered medications for this encounter.   Current Outpatient Medications:  .  norethindrone-ethinyl estradiol-iron (ESTROSTEP FE,TILIA FE,TRI-LEGEST FE) 1-20/1-30/1-35 MG-MCG tablet, Take 1 tablet by mouth daily., Disp: , Rfl:  .  ondansetron (ZOFRAN ODT) 4 MG disintegrating tablet, Take 1 tablet (4 mg total) by mouth every 8 (eight) hours as needed for nausea or vomiting., Disp: 15 tablet, Rfl: 0 .  permethrin (ELIMITE) 5 % cream, Apply 1 application topically once for 1 dose. Apply to entire body topically once. Massage well into hair. Repeat in 1 week.  Rx: 2 doses, Disp: 60 g, Rfl: 1 .  Prenatal Vit-Fe Fumarate-FA (PRENATAL MULTIVITAMIN) TABS tablet, Take 1 tablet by mouth daily at 12 noon., Disp: , Rfl:   Allergies Patient has no known allergies.  Family History  Problem Relation Age of Onset  . Depression Mother   . Miscarriages / [place] Mother   . Cancer Maternal Grandmother   . Cancer Paternal Grandmother     Social History Social History   Tobacco Use  . Smoking status: Former Games developermoker  . Smokeless tobacco: Never Used  Substance Use Topics  . Alcohol use: No  . Drug use: No    Review of Systems Constitutional: No fever/chills Cardiovascular: Denies chest pain. Respiratory: Denies shortness of breath. Gastrointestinal: No abdominal pain.  Denies current nausea or vomiting.  Denies diarrhea. Musculoskeletal: Negative for back pain. Skin:As above.  ____________________________________________   PHYSICAL EXAM:  VITAL SIGNS: ED Triage Vitals  Enc Vitals Group     BP 05/13/17 1930 116/79     Pulse Rate 05/13/17 1930 70     Resp 05/13/17 1930 16     Temp 05/13/17 1930 98.2 F (36.8 C)     Temp Source 05/13/17 1930 Oral     SpO2 05/13/17 1930 100 %     Weight 05/13/17 1931 160 lb (72.6 kg)     Height 05/13/17 1931 5' (1.524 m)     Head Circumference --      Peak Flow --      Pain Score --      Pain Loc --      Pain Edu? --  Excl. in GC? --     Constitutional: Alert and oriented. Well appearing and in no acute distress.      Head: Normocephalic and atraumatic.  Occiput of head scattered nits, no live adult lice noted. Musculoskeletal: Steady gait. Neurologic:  Normal speech and language. Speech is normal. No gait instability.  Skin:  Skin is warm, dry and intact. No rash noted.  No erythema. Psychiatric: Mood and affect are normal. Speech and behavior are normal. Patient exhibits appropriate insight and judgment   ___________________________________________   LABS (all labs ordered are listed,  but only abnormal results are displayed)  Labs Reviewed - No data to display   PROCEDURES Procedures    INITIAL IMPRESSION / ASSESSMENT AND PLAN / ED COURSE  Pertinent labs & imaging results that were available during my care of the patient were reviewed by me and considered in my medical decision making (see chart for details).  Well-appearing patient.  No acute distress. Nits noted.  Will treat with topical permethrin.  Discussed nit combs.  Discussed cleaning house linen, bedding, supportive care.  Discussed that all household contacts are treated.  Discussed strict follow-up and return parameters.  Discussed follow up with Primary care physician this week. Discussed follow up and return parameters including no resolution or any worsening concerns. Patient verbalized understanding and agreed to plan.   ____________________________________________   FINAL CLINICAL IMPRESSION(S) / ED DIAGNOSES  Final diagnoses:  Head lice infestation     ED Discharge Orders        Ordered    permethrin (ELIMITE) 5 % cream   Once,   Status:  Discontinued     05/13/17 2020    permethrin (ELIMITE) 5 % cream   Once     05/13/17 2023       Note: This dictation was prepared with Dragon dictation along with smaller phrase technology. Any transcriptional errors that result from this process are unintentional.         Renford DillsMiller, Avigayil Ton, NP 05/13/17 2046

## 2017-05-13 NOTE — Discharge Instructions (Signed)
Take medication as prescribed. Wash all clothing and lining.  Using nit comb.  May need to consider cutting hair.  Follow up with your primary care physician this week as needed. Return to Urgent care for new or worsening concerns.

## 2017-05-13 NOTE — Discharge Instructions (Signed)
Take medication as prescribed. Rest. Drink plenty of fluids.  ° °Follow up with your primary care physician this week as needed. Return to Urgent care for new or worsening concerns.  ° °

## 2017-07-27 IMAGING — DX DG ANKLE COMPLETE 3+V*R*
3 series · 3 of 3 positions shown · non-contrast
Comparison: None.

CLINICAL DATA: Trip and fall 2 days ago with persistent ankle pain,
initial encounter

EXAM:
RIGHT ANKLE - COMPLETE 3+ VIEW

[ankle ap]
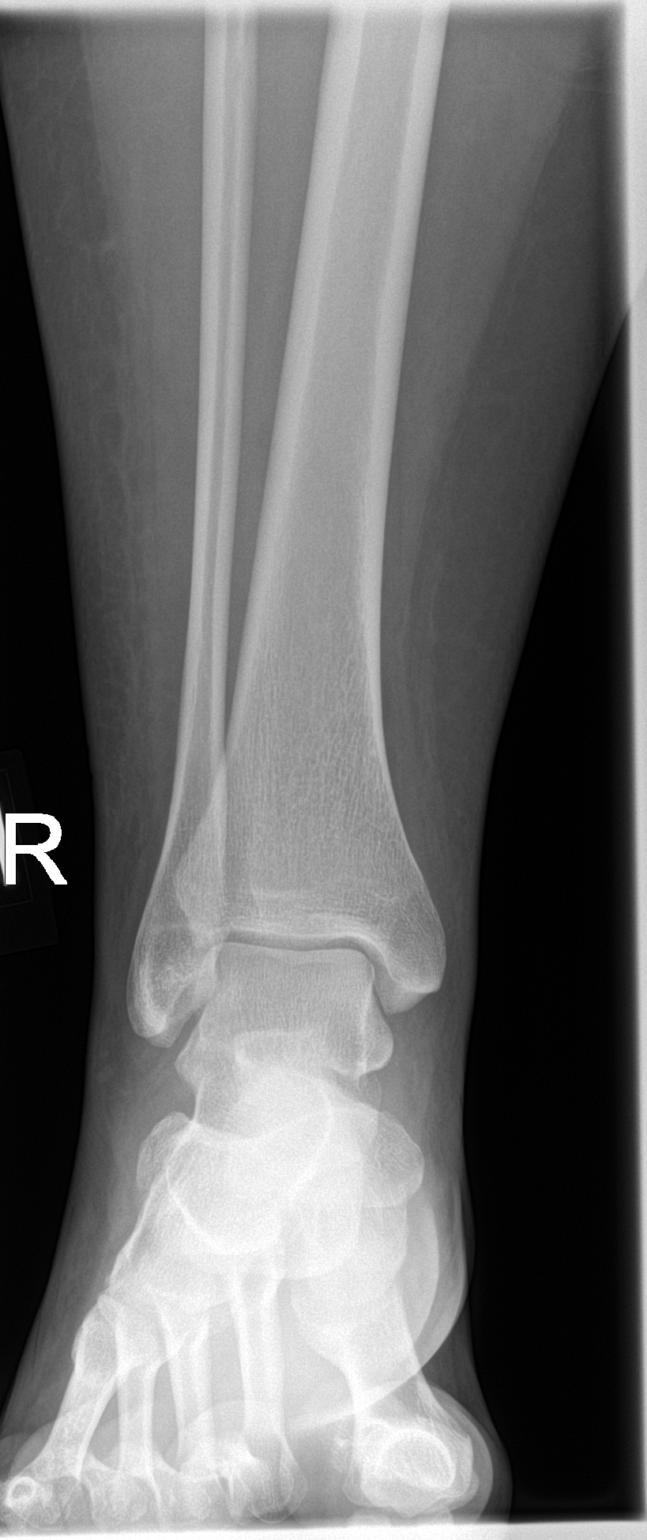

[ankle obl]
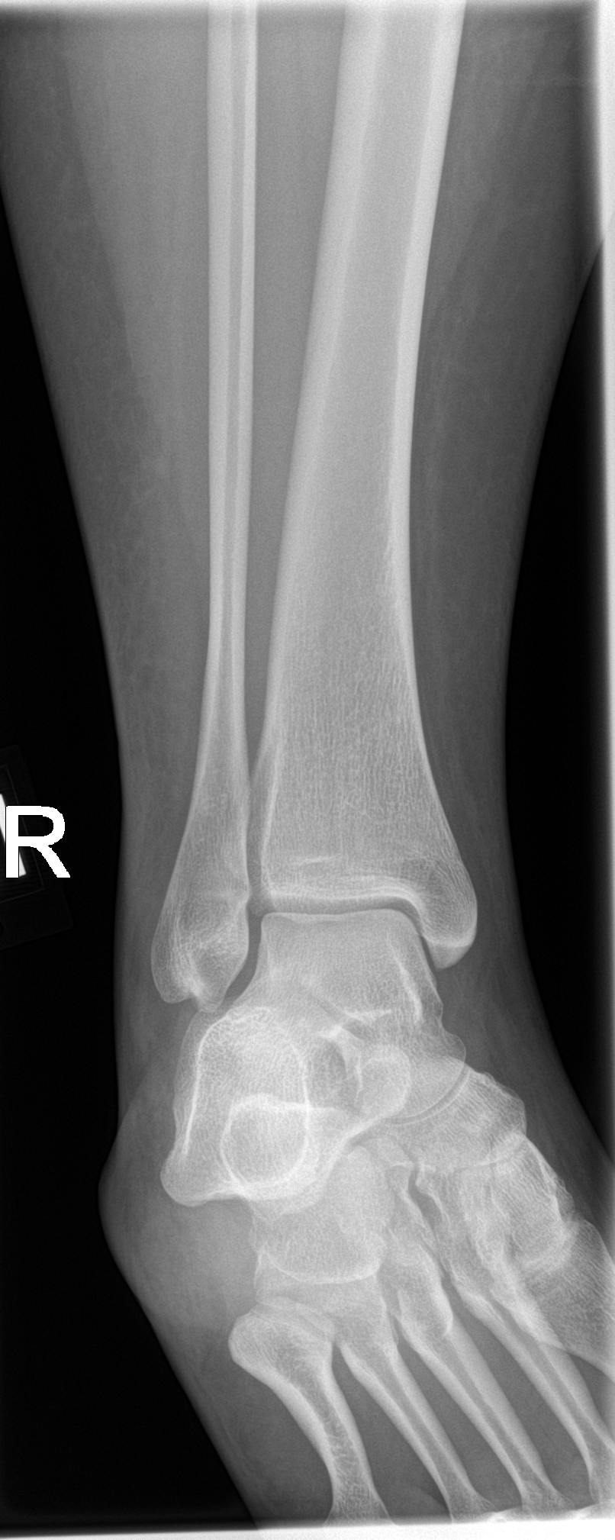

[ankle lat]
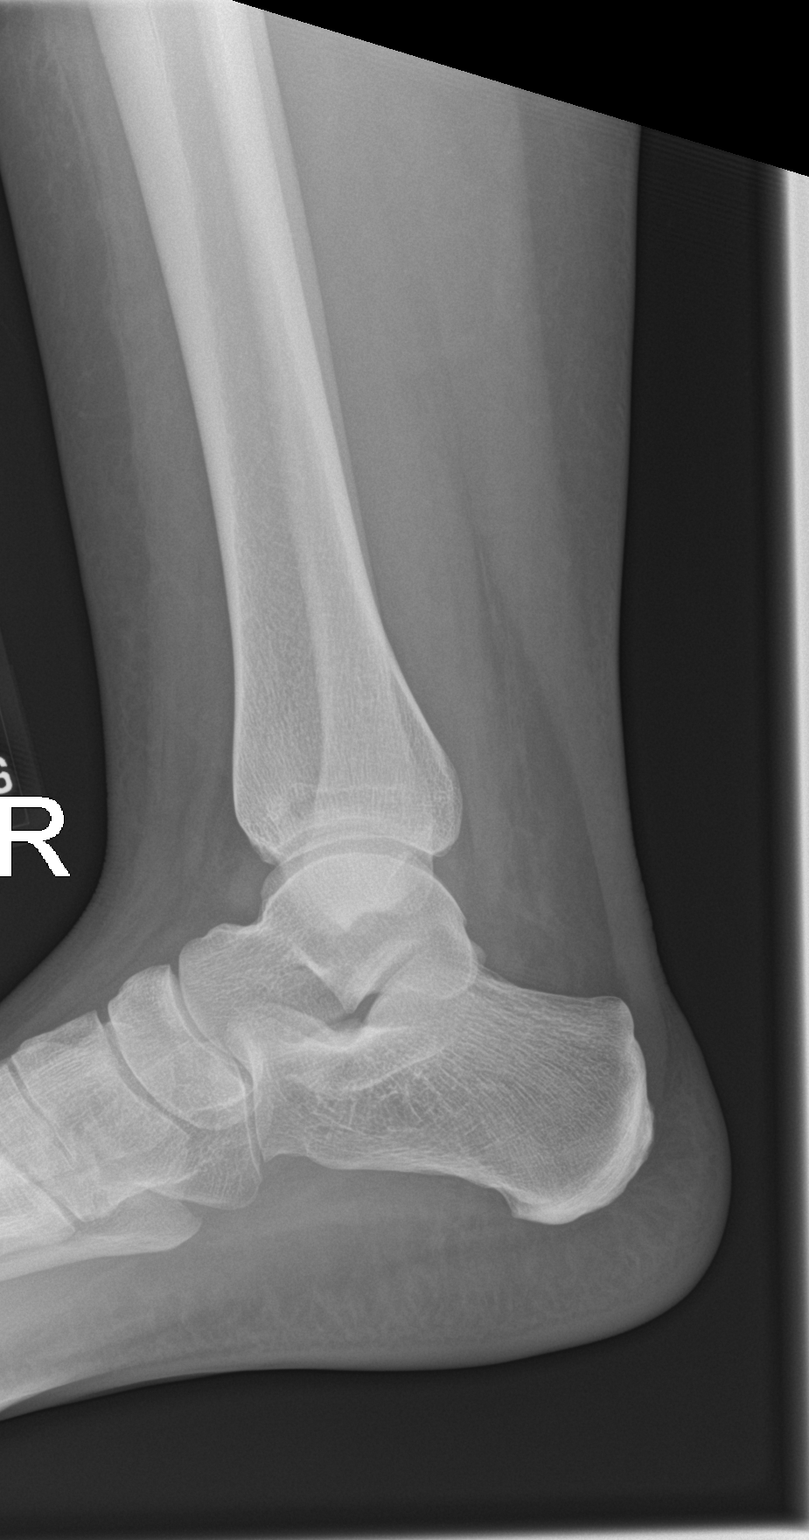

[3 of 3 positions shown; findings below may reference images not displayed]

FINDINGS: There is no evidence of fracture, dislocation, or joint effusion.
There is no evidence of arthropathy or other focal bone abnormality.
Soft tissues are unremarkable.
IMPRESSION: No acute abnormality noted.

## 2017-08-06 ENCOUNTER — Other Ambulatory Visit: Payer: Self-pay

## 2017-08-06 ENCOUNTER — Ambulatory Visit
Admission: EM | Admit: 2017-08-06 | Discharge: 2017-08-06 | Disposition: A | Discharge status: Home / Self Care | Payer: Medicaid Other | Attending: Family Medicine | Admitting: Family Medicine

## 2017-08-06 DIAGNOSIS — T6291XA Toxic effect of unspecified noxious substance eaten as food, accidental (unintentional), initial encounter: Secondary | ICD-10-CM

## 2017-08-06 DIAGNOSIS — R109 Unspecified abdominal pain: Secondary | ICD-10-CM | POA: Diagnosis not present

## 2017-08-06 DIAGNOSIS — R110 Nausea: Secondary | ICD-10-CM

## 2017-08-06 DIAGNOSIS — IMO0001 Reserved for inherently not codable concepts without codable children: Secondary | ICD-10-CM

## 2017-08-06 HISTORY — DX: Gastro-esophageal reflux disease without esophagitis: K21.9

## 2017-08-06 MED ORDER — ONDANSETRON 8 MG PO TBDP: 8.0000 mg | ORAL_TABLET | Freq: Three times a day (TID) | ORAL | 0 refills | Status: DC | PRN

## 2017-08-06 NOTE — ED Provider Notes (Signed)
[facility]    CSN: [id number] Arrival date & time: 08/06/17  0932     History   Chief Complaint Chief Complaint  Patient presents with  . Abdominal Pain    HPI Bari MantisCourtney N Erickson is a 27 y.o. female.   27 yo female with a c/o abdominal cramping and nausea since last night after eating a salad from [facility]. Denies any vomiting, diarrhea, fevers, but has felt some mild chills.   The history is provided by the patient.    Past Medical History:  Diagnosis Date  . GERD (gastroesophageal reflux disease)     Patient Active Problem List   Diagnosis Date Noted  . Indication for care in labor or delivery 07/02/2016  . Vaginal bleeding 03/08/2016  . Vaginal bleeding in pregnancy, second trimester 03/08/2016  . First trimester screening 12/29/2015    Past Surgical History:  Procedure Laterality Date  . NO PAST SURGERIES      OB History    Gravida  1   Para      Term      Preterm      AB      Living        SAB      TAB      Ectopic      Multiple      Live Births               Home Medications    Prior to Admission medications   Medication Sig Start Date End Date Taking? Authorizing Provider  norethindrone-ethinyl estradiol-iron (ESTROSTEP FE,TILIA FE,TRI-LEGEST FE) 1-20/1-30/1-35 MG-MCG tablet Take 1 tablet by mouth daily.    [provider]  ondansetron (ZOFRAN ODT) 8 MG disintegrating tablet Take 1 tablet (8 mg total) by mouth every 8 (eight) hours as needed. 08/06/17   Cassandra Erickson, Ravon Mcilhenny, MD  Prenatal Vit-Fe Fumarate-FA (PRENATAL MULTIVITAMIN) TABS tablet Take 1 tablet by mouth daily at 12 noon.    [provider]    Family History Family History  Problem Relation Age of Onset  . Depression Mother   . Miscarriages / [place] Mother   . Cancer Maternal Grandmother   . Cancer Paternal Grandmother     Social History Social History   Tobacco Use  . Smoking status: Former Smoker    Types: Cigarettes  .  Smokeless tobacco: Never Used  Substance Use Topics  . Alcohol use: No  . Drug use: No     Allergies   Patient has no known allergies.   Review of Systems Review of Systems   Physical Exam Triage Vital Signs ED Triage Vitals  Enc Vitals Group     BP 08/06/17 1008 125/76     Pulse Rate 08/06/17 1008 63     Resp 08/06/17 1008 16     Temp 08/06/17 1008 97.8 F (36.6 C)     Temp Source 08/06/17 1008 Oral     SpO2 08/06/17 1008 100 %     Weight 08/06/17 1010 160 lb (72.6 kg)     Height 08/06/17 1010 4\' 11"  (1.499 m)     Head Circumference --      Peak Flow --      Pain Score 08/06/17 1010 10     Pain Loc --      Pain Edu? --      Excl. in [place]? --    No data found.  Updated Vital Signs BP 125/76 (BP Location: Right Arm)   Pulse 63  Temp 97.8 F (36.6 C) (Oral)   Resp 16   Ht 4\' 11"  (1.499 m)   Wt 160 lb (72.6 kg)   LMP 07/14/2017   SpO2 100%   BMI 32.32 kg/m   Visual Acuity Right Eye Distance:   Left Eye Distance:   Bilateral Distance:    Right Eye Near:   Left Eye Near:    Bilateral Near:     Physical Exam  Constitutional: She appears well-developed and well-nourished. No distress.  Abdominal: Soft. Bowel sounds are normal. She exhibits no distension and no mass. There is no tenderness. There is no rebound and no guarding.  Skin: She is not diaphoretic.  Nursing note and vitals reviewed.    UC Treatments / Results  Labs (all labs ordered are listed, but only abnormal results are displayed) Labs Reviewed - No data to display  EKG None Radiology No results found.  Procedures Procedures (including critical care time)  Medications Ordered in UC Medications - No data to display   Initial Impression / Assessment and Plan / UC Course  I have reviewed the triage vital signs and the nursing notes.  Pertinent labs & imaging results that were available during my care of the patient were reviewed by me and considered in my medical decision making  (see chart for details).       Final Clinical Impressions(s) / UC Diagnoses   Final diagnoses:  Food poisoning, accidental or unintentional, initial encounter    ED Discharge Orders        Ordered    ondansetron (ZOFRAN ODT) 8 MG disintegrating tablet  Every 8 hours PRN     08/06/17 1154     1. diagnosis reviewed with patient 2. rx as per orders above; reviewed possible side effects, interactions, risks and benefits  3. Recommend supportive treatment with clear liquids then advance diet slowly as tolerated 4. Follow-up prn if symptoms worsen or don't improve  Controlled Substance Prescriptions [place] Controlled Substance Registry consulted? Not Applicable   Cassandra Mccallum, MD 08/06/17 [phone]

## 2017-08-06 NOTE — ED Triage Notes (Addendum)
Pt with abdominal cramping starting 10 minutes after [facility]. No vomiting, no diarrhea. Pain 10/10. Pt reports she left work today and needs a work note

## 2019-05-16 ENCOUNTER — Other Ambulatory Visit: Payer: Self-pay

## 2019-05-16 ENCOUNTER — Ambulatory Visit
Admission: EM | Admit: 2019-05-16 | Discharge: 2019-05-16 | Disposition: A | Discharge status: Home / Self Care | Payer: Medicaid Other | Attending: Family Medicine | Admitting: Family Medicine

## 2019-05-16 DIAGNOSIS — N390 Urinary tract infection, site not specified: Secondary | ICD-10-CM | POA: Insufficient documentation

## 2019-05-16 LAB — URINALYSIS, COMPLETE (UACMP) WITH MICROSCOPIC
Bilirubin Urine: NEGATIVE
Glucose, UA: NEGATIVE mg/dL
Hgb urine dipstick: NEGATIVE
Ketones, ur: NEGATIVE mg/dL
Nitrite: POSITIVE — AB
Protein, ur: NEGATIVE mg/dL
RBC / HPF: NONE SEEN RBC/hpf (ref 0–5)
Specific Gravity, Urine: 1.025 (ref 1.005–1.030)
pH: 7.5 (ref 5.0–8.0)

## 2019-05-16 MED ORDER — CEPHALEXIN 500 MG PO CAPS: 500.0000 mg | ORAL_CAPSULE | Freq: Two times a day (BID) | ORAL | 0 refills | Status: DC

## 2019-05-16 NOTE — Discharge Instructions (Signed)
Increase water intake Tylenol as needed 

## 2019-05-16 NOTE — ED Triage Notes (Signed)
Patient states that she has noticed a smell with her urine x 1 week ago.

## 2019-05-16 NOTE — ED Provider Notes (Signed)
[facility]    CSN: [id number] Arrival date & time: 05/16/19  4196      History   Chief Complaint Chief Complaint  Patient presents with  . Urinary Urgency    HPI Cassandra Erickson is a 28 y.o. female.   28 yo female with a c/o a strong smell of her urine for the past week. Denies any fevers, chills, abdominal pain, nausea, vomiting, hematuria.      Past Medical History:  Diagnosis Date  . GERD (gastroesophageal reflux disease)     Patient Active Problem List   Diagnosis Date Noted  . Indication for care in labor or delivery 07/02/2016  . Vaginal bleeding 03/08/2016  . Vaginal bleeding in pregnancy, second trimester 03/08/2016  . First trimester screening 12/29/2015    Past Surgical History:  Procedure Laterality Date  . NO PAST SURGERIES      OB History    Gravida  1   Para      Term      Preterm      AB      Living        SAB      TAB      Ectopic      Multiple      Live Births               Home Medications    Prior to Admission medications   Medication Sig Start Date End Date Taking? Authorizing Provider  norethindrone-ethinyl estradiol-iron (ESTROSTEP FE,TILIA FE,TRI-LEGEST FE) 1-20/1-30/1-35 MG-MCG tablet Take 1 tablet by mouth daily.   Yes [provider]  cephALEXin (KEFLEX) 500 MG capsule Take 1 capsule (500 mg total) by mouth 2 (two) times daily. 05/16/19   Norval Gable, MD  ondansetron (ZOFRAN ODT) 8 MG disintegrating tablet Take 1 tablet (8 mg total) by mouth every 8 (eight) hours as needed. 08/06/17   Norval Gable, MD  Prenatal Vit-Fe Fumarate-FA (PRENATAL MULTIVITAMIN) TABS tablet Take 1 tablet by mouth daily at 12 noon.    [provider]    Family History Family History  Problem Relation Age of Onset  . Depression Mother   . Miscarriages / [place] Mother   . Cancer Maternal Grandmother   . Cancer Paternal Grandmother     Social History Social History   Tobacco Use  .  Smoking status: Former Smoker    Types: Cigarettes  . Smokeless tobacco: Never Used  Substance Use Topics  . Alcohol use: No  . Drug use: No     Allergies   Patient has no known allergies.   Review of Systems Review of Systems   Physical Exam Triage Vital Signs ED Triage Vitals  Enc Vitals Group     BP 05/16/19 0901 114/64     Pulse Rate 05/16/19 0901 70     Resp 05/16/19 0901 17     Temp 05/16/19 0901 98.1 F (36.7 C)     Temp Source 05/16/19 0901 Oral     SpO2 05/16/19 0901 97 %     Weight 05/16/19 0859 140 lb (63.5 kg)     Height 05/16/19 0859 4\' 11"  (1.499 m)     Head Circumference --      Peak Flow --      Pain Score 05/16/19 0858 0     Pain Loc --      Pain Edu? --      Excl. in [place]? --    No data found.  Updated  Vital Signs BP 114/64 (BP Location: Left Arm)   Pulse 70   Temp 98.1 F (36.7 C) (Oral)   Resp 17   Ht 4\' 11"  (1.499 m)   Wt 63.5 kg   LMP 05/09/2019   SpO2 97%   BMI 28.28 kg/m   Visual Acuity Right Eye Distance:   Left Eye Distance:   Bilateral Distance:    Right Eye Near:   Left Eye Near:    Bilateral Near:     Physical Exam Vitals and nursing note reviewed.  Constitutional:      General: She is not in acute distress.    Appearance: She is not toxic-appearing or diaphoretic.  Abdominal:     General: There is no distension.  Neurological:     Mental Status: She is alert.      UC Treatments / Results  Labs (all labs ordered are listed, but only abnormal results are displayed) Labs Reviewed  URINALYSIS, COMPLETE (UACMP) WITH MICROSCOPIC - Abnormal; Notable for the following components:      Result Value   APPearance CLOUDY (*)    Nitrite POSITIVE (*)    Leukocytes,Ua TRACE (*)    Bacteria, UA MANY (*)    All other components within normal limits  URINE CULTURE    EKG   Radiology No results found.  Procedures Procedures (including critical care time)  Medications Ordered in UC Medications - No data to  display  Initial Impression / Assessment and Plan / UC Course  I have reviewed the triage vital signs and the nursing notes.  Pertinent labs & imaging results that were available during my care of the patient were reviewed by me and considered in my medical decision making (see chart for details).      Final Clinical Impressions(s) / UC Diagnoses   Final diagnoses:  Urinary tract infection without hematuria, site unspecified     Discharge Instructions     Increase water intake Tylenol as needed    ED Prescriptions    Medication Sig Dispense Auth. Provider   cephALEXin (KEFLEX) 500 MG capsule Take 1 capsule (500 mg total) by mouth 2 (two) times daily. 14 capsule 05/11/2019, MD      1. Lab results and diagnosis reviewed with patient 2. rx as per orders above; reviewed possible side effects, interactions, risks and benefits  3. Recommend supportive treatment as above 4. Follow-up prn if symptoms worsen or don't improve   PDMP not reviewed this encounter.   Payton Mccallum, MD 05/16/19 [phone]

## 2019-05-18 LAB — URINE CULTURE
Culture: >=100000 — AB
Special Requests: NORMAL

## 2019-06-10 ENCOUNTER — Emergency Department
Admission: EM | Admit: 2019-06-10 | Discharge: 2019-06-10 | Disposition: A | Discharge status: Home / Self Care | Payer: Medicaid Other | Attending: Emergency Medicine | Admitting: Emergency Medicine

## 2019-06-10 ENCOUNTER — Emergency Department: Payer: Medicaid Other

## 2019-06-10 ENCOUNTER — Encounter: Payer: Self-pay | Admitting: Emergency Medicine

## 2019-06-10 ENCOUNTER — Other Ambulatory Visit: Payer: Self-pay

## 2019-06-10 DIAGNOSIS — Z87891 Personal history of nicotine dependence: Secondary | ICD-10-CM | POA: Insufficient documentation

## 2019-06-10 DIAGNOSIS — R0989 Other specified symptoms and signs involving the circulatory and respiratory systems: Secondary | ICD-10-CM | POA: Insufficient documentation

## 2019-06-10 DIAGNOSIS — Z79899 Other long term (current) drug therapy: Secondary | ICD-10-CM | POA: Insufficient documentation

## 2019-06-10 DIAGNOSIS — J069 Acute upper respiratory infection, unspecified: Secondary | ICD-10-CM | POA: Insufficient documentation

## 2019-06-10 DIAGNOSIS — Z20822 Contact with and (suspected) exposure to covid-19: Secondary | ICD-10-CM | POA: Insufficient documentation

## 2019-06-10 LAB — SARS CORONAVIRUS 2 (TAT 6-24 HRS): SARS Coronavirus 2: NEGATIVE

## 2019-06-10 MED ORDER — ONDANSETRON 4 MG PO TBDP: 4.0000 mg | ORAL_TABLET | Freq: Four times a day (QID) | ORAL | 0 refills | Status: DC | PRN

## 2019-06-10 MED ORDER — BENZONATATE 100 MG PO CAPS: 100.0000 mg | ORAL_CAPSULE | Freq: Four times a day (QID) | ORAL | 0 refills | Status: DC | PRN

## 2019-06-10 NOTE — ED Notes (Signed)
Pt states having a cough that started 2 days ago. Pt states she ate dinner but when she tried to take medicine for her cough that she wasn't able to keep it down. Pt also states having a close covid contact and that she got tested yesterday but doesn't have the results yet.   Denies fevers, shob, n/d, and body aches/weakness.

## 2019-06-10 NOTE — ED Triage Notes (Signed)
Patient states that she has been coughing times two months.

## 2019-06-10 NOTE — ED Provider Notes (Signed)
[facility] Emergency Department Provider Note   ____________________________________________   First MD Initiated Contact with Patient 06/10/19 0111     (approximate)  I have reviewed the triage vital signs and the nursing notes.   HISTORY  Chief Complaint Cough    HPI Cassandra Erickson is a 29 y.o. female for evaluation of 2 days cough  Patient has been experiencing a dry cough and slight runny nose for about 2 days.  She did have an exposure to coronavirus about 7 days ago through someone she was around for about 2 hours.  She got a coronavirus test that [facility] couple days ago but results not come back yet.  She has been out of work pending results  She is not having shortness of breath no chest pain.  She would like something to relieve her cough she is coughing so often it makes it hard for her to eat as its just a frequent dry cough.  She still is able to eat however  Denies pregnancy.  No abdominal pain.  Otherwise feeling well but just frequent coughing make it hard to sleep and function is so frequent   Past Medical History:  Diagnosis Date  . GERD (gastroesophageal reflux disease)     Patient Active Problem List   Diagnosis Date Noted  . Indication for care in labor or delivery 07/02/2016  . Vaginal bleeding 03/08/2016  . Vaginal bleeding in pregnancy, second trimester 03/08/2016  . First trimester screening 12/29/2015    Past Surgical History:  Procedure Laterality Date  . NO PAST SURGERIES      Prior to Admission medications   Medication Sig Start Date End Date Taking? Authorizing Provider  benzonatate (TESSALON PERLES) 100 MG capsule Take 1 capsule (100 mg total) by mouth every 6 (six) hours as needed for cough. 06/10/19 06/09/20  Sharyn Creamer, MD  cephALEXin (KEFLEX) 500 MG capsule Take 1 capsule (500 mg total) by mouth 2 (two) times daily. 05/16/19   Payton Mccallum, MD  norethindrone-ethinyl estradiol-iron (ESTROSTEP  FE,TILIA FE,TRI-LEGEST FE) 1-20/1-30/1-35 MG-MCG tablet Take 1 tablet by mouth daily.    [provider]  ondansetron (ZOFRAN ODT) 4 MG disintegrating tablet Take 1 tablet (4 mg total) by mouth every 6 (six) hours as needed for nausea or vomiting. 06/10/19   Sharyn Creamer, MD  Prenatal Vit-Fe Fumarate-FA (PRENATAL MULTIVITAMIN) TABS tablet Take 1 tablet by mouth daily at 12 noon.    [provider]    Allergies Patient has no known allergies.  Family History  Problem Relation Age of Onset  . Depression Mother   . Miscarriages / [place] Mother   . Cancer Maternal Grandmother   . Cancer Paternal Grandmother     Social History Social History   Tobacco Use  . Smoking status: Former Smoker    Types: Cigarettes  . Smokeless tobacco: Never Used  Substance Use Topics  . Alcohol use: No  . Drug use: No    Review of Systems Constitutional: No fever/chills Eyes: No visual changes. ENT: No sore throat. Cardiovascular: Denies chest pain. Respiratory: Denies shortness of breath.  Frequent dry cough Gastrointestinal: No abdominal pain.   Genitourinary: Denies pregnancy Musculoskeletal: Some muscle aches Skin: Negative for rash. Neurological: Negative for headaches.    ____________________________________________   PHYSICAL EXAM:  VITAL SIGNS: ED Triage Vitals  Enc Vitals Group     BP 06/10/19 0036 102/60     Pulse Rate 06/10/19 0036 81     Resp 06/10/19  0036 16     Temp 06/10/19 0036 98.5 F (36.9 C)     Temp Source 06/10/19 0036 Oral     SpO2 06/10/19 0036 100 %     Weight 06/10/19 0034 145 lb (65.8 kg)     Height 06/10/19 0034 4\' 11"  (1.499 m)     Head Circumference --      Peak Flow --      Pain Score 06/10/19 0034 0     Pain Loc --      Pain Edu? --      Excl. in [place]? --     Constitutional: Alert and oriented. Well appearing and in no acute distress.  Is very pleasant. Eyes: Conjunctivae are normal. Head: Atraumatic. Nose: No  congestion/rhinnorhea. Neck: No stridor.  Cardiovascular: Normal rate, regular rhythm. Grossly normal heart sounds.  Good peripheral circulation. Respiratory: Normal respiratory effort.  No retractions. Lungs CTAB.  Frequent dry cough.  No distress however speaks in full clear sentences with normal oxygen saturation. Gastrointestinal: Soft and nontender. No distention.  Bellybutton ring present. Musculoskeletal: No lower extremity tenderness nor edema. Neurologic:  Normal speech and language. No gross focal neurologic deficits are appreciated.  Skin:  Skin is warm, dry and intact. No rash noted. Psychiatric: Mood and affect are normal. Speech and behavior are normal.  ____________________________________________   LABS (all labs ordered are listed, but only abnormal results are displayed)  Labs Reviewed  SARS CORONAVIRUS 2 (TAT 6-24 HRS)   ____________________________________________  EKG   ____________________________________________  RADIOLOGY  .DG Chest 2 View  Result Date: 06/10/2019 CLINICAL DATA:  Cough for 2 months. EXAM: CHEST - 2 VIEW COMPARISON:  None. FINDINGS: The cardiomediastinal contours are normal. Mild bronchial thickening. Pulmonary vasculature is normal. No consolidation, pleural effusion, or pneumothorax. No acute osseous abnormalities are seen. IMPRESSION: Mild bronchial thickening, can be seen with asthma or bronchitis. Electronically Signed   By: Keith Rake M.D.   On: 06/10/2019 01:00    Imaging reviewed positive for mild bronchial type pattern.  No infiltrate. ____________________________________________   PROCEDURES  Procedure(s) performed: None  Procedures  Critical Care performed: No  ____________________________________________   INITIAL IMPRESSION / ASSESSMENT AND PLAN / ED COURSE  Pertinent labs & imaging results that were available during my care of the patient were reviewed by me and considered in my medical decision making (see  chart for details).   Dry cough for 2 days.  Reassuring examination with normal work of breathing.  Positive for Covid exposure.  [facility], would be interested in getting results sooner.  Will order Covid test here as well.  She is awake alert well-appearing and stable.  There is no evidence of acute infiltrate and she is already performing home quarantine measures.  No evidence of pneumonia.  No chest pain no trouble breathing.  Return precautions and treatment recommendations and follow-up discussed with the patient who is agreeable with the plan.  Discussed prescriptions, including side effects potentially of Tessalon Perles.  Will prescribe to her and she will pick up tomorrow.  She did drive herself here today       ____________________________________________   FINAL CLINICAL IMPRESSION(S) / ED DIAGNOSES  Final diagnoses:  Viral URI with cough        Note:  This document was prepared using Dragon voice recognition software and may include unintentional dictation errors       Delman Kitten, MD 06/10/19 0131

## 2019-06-10 NOTE — ED Notes (Signed)
Patient to xray.

## 2019-06-11 ENCOUNTER — Telehealth: Payer: Self-pay | Admitting: *Deleted

## 2019-06-11 NOTE — Telephone Encounter (Signed)
Patient called for assistance with her my chart account. 

## 2019-09-01 ENCOUNTER — Encounter: Payer: Self-pay | Admitting: Emergency Medicine

## 2019-09-01 ENCOUNTER — Ambulatory Visit
Admission: EM | Admit: 2019-09-01 | Discharge: 2019-09-01 | Disposition: A | Discharge status: Home / Self Care | Payer: Medicaid Other | Attending: Family Medicine | Admitting: Family Medicine

## 2019-09-01 ENCOUNTER — Other Ambulatory Visit: Payer: Self-pay

## 2019-09-01 DIAGNOSIS — Z20822 Contact with and (suspected) exposure to covid-19: Secondary | ICD-10-CM | POA: Insufficient documentation

## 2019-09-01 DIAGNOSIS — J0100 Acute maxillary sinusitis, unspecified: Secondary | ICD-10-CM | POA: Diagnosis present

## 2019-09-01 DIAGNOSIS — Z87891 Personal history of nicotine dependence: Secondary | ICD-10-CM | POA: Insufficient documentation

## 2019-09-01 DIAGNOSIS — Z79899 Other long term (current) drug therapy: Secondary | ICD-10-CM | POA: Insufficient documentation

## 2019-09-01 MED ORDER — AMOXICILLIN-POT CLAVULANATE 875-125 MG PO TABS: 1.0000 | ORAL_TABLET | Freq: Two times a day (BID) | ORAL | 0 refills | Status: DC

## 2019-09-01 MED ORDER — IPRATROPIUM BROMIDE 0.06 % NA SOLN: 2.0000 | Freq: Four times a day (QID) | NASAL | 0 refills | Status: DC | PRN

## 2019-09-01 NOTE — ED Triage Notes (Signed)
Patient c/o sinus pain and pressure that started 2 days ago.  Patient states that her cough started yesterday.  Patient denies fevers.

## 2019-09-01 NOTE — Discharge Instructions (Signed)
Continue Claritin.   Medications as prescribed.  Awaiting COVID test results.  Take care  Dr. Adriana Simas

## 2019-09-01 NOTE — ED Provider Notes (Signed)
[facility]    CSN: [id number] Arrival date & time: 09/01/19  0816  History   Chief Complaint Chief Complaint  Patient presents with  . Sinus Problem   HPI  29 year old female presents with the above complaint.  Patient reports that she has had sinus pain, pressure, dental pain, rhinorrhea for the past 3 days.  She now has cough as well.  No fever.  No reported sick contacts.  She needs a work note today as well.  She has taken Claritin without relief.  No other medications or interventions tried.  No known exacerbating factors.  No other associated symptoms.  No other complaints.  Past Medical History:  Diagnosis Date  . GERD (gastroesophageal reflux disease)    Patient Active Problem List   Diagnosis Date Noted  . Indication for care in labor or delivery 07/02/2016  . Vaginal bleeding 03/08/2016  . Vaginal bleeding in pregnancy, second trimester 03/08/2016  . First trimester screening 12/29/2015   Past Surgical History:  Procedure Laterality Date  . NO PAST SURGERIES     OB History    Gravida  1   Para      Term      Preterm      AB      Living        SAB      TAB      Ectopic      Multiple      Live Births             Home Medications    Prior to Admission medications   Medication Sig Start Date End Date Taking? Authorizing Provider  norethindrone-ethinyl estradiol-iron (ESTROSTEP FE,TILIA FE,TRI-LEGEST FE) 1-20/1-30/1-35 MG-MCG tablet Take 1 tablet by mouth daily.   Yes [provider]  amoxicillin-clavulanate (AUGMENTIN) 875-125 MG tablet Take 1 tablet by mouth every 12 (twelve) hours. 09/01/19   Thersa Salt G, DO  ipratropium (ATROVENT) 0.06 % nasal spray Place 2 sprays into both nostrils 4 (four) times daily as needed for rhinitis. 09/01/19   Coral Spikes, DO  ondansetron (ZOFRAN ODT) 4 MG disintegrating tablet Take 1 tablet (4 mg total) by mouth every 6 (six) hours as needed for nausea or vomiting. 06/10/19   Delman Kitten,  MD   Family History Family History  Problem Relation Age of Onset  . Depression Mother   . Miscarriages / [place] Mother   . Cancer Maternal Grandmother   . Cancer Paternal Grandmother    Social History Social History   Tobacco Use  . Smoking status: Former Smoker    Types: Cigarettes  . Smokeless tobacco: Never Used  Substance Use Topics  . Alcohol use: No  . Drug use: No    Allergies   Patient has no known allergies.   Review of Systems Review of Systems  Constitutional: Negative for fever.  HENT: Positive for congestion, rhinorrhea, sinus pressure and sinus pain.   Respiratory: Positive for cough.    Physical Exam Triage Vital Signs ED Triage Vitals  Enc Vitals Group     BP 09/01/19 0841 111/85     Pulse Rate 09/01/19 0841 89     Resp 09/01/19 0841 14     Temp 09/01/19 0841 98.2 F (36.8 C)     Temp Source 09/01/19 0841 Oral     SpO2 09/01/19 0841 100 %     Weight 09/01/19 0837 146 lb (66.2 kg)     Height 09/01/19 0837 4\' 11"  (1.499 m)  Head Circumference --      Peak Flow --      Pain Score 09/01/19 0837 7     Pain Loc --      Pain Edu? --    Updated Vital Signs BP 111/85 (BP Location: Left Arm)   Pulse 89   Temp 98.2 F (36.8 C) (Oral)   Resp 14   Ht 4\' 11"  (1.499 m)   Wt 66.2 kg   LMP 08/28/2019 (Approximate)   SpO2 100%   BMI 29.49 kg/m   Visual Acuity Right Eye Distance:   Left Eye Distance:   Bilateral Distance:    Right Eye Near:   Left Eye Near:    Bilateral Near:     Physical Exam Vitals and nursing note reviewed.  Constitutional:      General: She is not in acute distress.    Appearance: Normal appearance. She is not ill-appearing.  HENT:     Head: Normocephalic and atraumatic.     Right Ear: Tympanic membrane normal.     Left Ear: Tympanic membrane normal.     Mouth/Throat:     Pharynx: Oropharynx is clear. No oropharyngeal exudate or posterior oropharyngeal erythema.  Eyes:     General:        Right eye: No  discharge.        Left eye: No discharge.     Conjunctiva/sclera: Conjunctivae normal.  Cardiovascular:     Rate and Rhythm: Normal rate and regular rhythm.     Heart sounds: No murmur.  Pulmonary:     Effort: Pulmonary effort is normal.     Breath sounds: Normal breath sounds. No wheezing, rhonchi or rales.  Neurological:     Mental Status: She is alert.  Psychiatric:        Mood and Affect: Mood normal.        Behavior: Behavior normal.    UC Treatments / Results  Labs (all labs ordered are listed, but only abnormal results are displayed) Labs Reviewed  SARS CORONAVIRUS 2 (TAT 6-24 HRS)    EKG   Radiology No results found.  Procedures Procedures (including critical care time)  Medications Ordered in UC Medications - No data to display  Initial Impression / Assessment and Plan / UC Course  I have reviewed the triage vital signs and the nursing notes.  Pertinent labs & imaging results that were available during my care of the patient were reviewed by me and considered in my medical decision making (see chart for details).    29 year old female presents with sinusitis. Treating with Augmenting and Atrovent. Awaiting COVID test result.  Final Clinical Impressions(s) / UC Diagnoses   Final diagnoses:  Acute maxillary sinusitis, recurrence not specified     Discharge Instructions     Continue Claritin.   Medications as prescribed.  Awaiting COVID test results.  Take care  Dr. 26    ED Prescriptions    Medication Sig Dispense Auth. Provider   amoxicillin-clavulanate (AUGMENTIN) 875-125 MG tablet Take 1 tablet by mouth every 12 (twelve) hours. 20 tablet Lehman Whiteley G, DO   ipratropium (ATROVENT) 0.06 % nasal spray Place 2 sprays into both nostrils 4 (four) times daily as needed for rhinitis. 15 mL 06-21-2003, DO     PDMP not reviewed this encounter.   Tommie Sams [place], [place] 09/01/19 [phone]

## 2019-09-02 LAB — SARS CORONAVIRUS 2 (TAT 6-24 HRS): SARS Coronavirus 2: NEGATIVE

## 2019-09-17 ENCOUNTER — Ambulatory Visit
Admission: EM | Admit: 2019-09-17 | Discharge: 2019-09-17 | Disposition: A | Discharge status: Home / Self Care | Payer: Medicaid Other | Attending: Family Medicine | Admitting: Family Medicine

## 2019-09-17 ENCOUNTER — Other Ambulatory Visit: Payer: Self-pay

## 2019-09-17 DIAGNOSIS — A084 Viral intestinal infection, unspecified: Secondary | ICD-10-CM

## 2019-09-17 DIAGNOSIS — R112 Nausea with vomiting, unspecified: Secondary | ICD-10-CM

## 2019-09-17 DIAGNOSIS — R110 Nausea: Secondary | ICD-10-CM

## 2019-09-17 LAB — PREGNANCY, URINE: Preg Test, Ur: NEGATIVE

## 2019-09-17 MED ORDER — ONDANSETRON 4 MG PO TBDP: 4.0000 mg | ORAL_TABLET | Freq: Four times a day (QID) | ORAL | 0 refills | Status: DC | PRN

## 2019-09-17 NOTE — Discharge Instructions (Signed)
-  Drink plenty of water and fluids today.  Ginger ale but not soda if tolerated. Parke Simmers diet, soup, crackers. -Take anti-nausea medication as directed. -Follow-up if worsening.

## 2019-09-17 NOTE — ED Triage Notes (Signed)
Vomiting x 1, lightheaded, dizziness, nauseated since this morning.  Unable to keep food down but can keep water down.    Pt states she has not been taking her birth control regularly.  LMP 08/28/2019.  Has had unprotected intercourse since LMP.

## 2019-09-17 NOTE — ED Provider Notes (Signed)
[facility]    CSN: [id number] Arrival date & time: 09/17/19  1720      History   Chief Complaint Chief Complaint  Patient presents with  . Nausea    HPI Cassandra Erickson is a 29 y.o. female presents today for a less than 24-hour history of nausea and vomiting.  The patient was at work earlier when she began to experience nausea and has had 2 episodes of vomiting.  She denies any diarrhea or stomach pain.  The patient has been able to drink water and hold down however she states that she try to eat pizza earlier today and threw it up.  She denies any travel, she denies any new foods.  She denies any loss of taste or smell.  She denies any fevers or chills at home.  The patient states that her last menstrual cycle was 20 days ago and states that she does not take her birth control regularly.  Presents today for evaluation of nausea and vomiting and pregnancy test.  HPI  Past Medical History:  Diagnosis Date  . GERD (gastroesophageal reflux disease)     Patient Active Problem List   Diagnosis Date Noted  . Indication for care in labor or delivery 07/02/2016  . Vaginal bleeding 03/08/2016  . Vaginal bleeding in pregnancy, second trimester 03/08/2016  . First trimester screening 12/29/2015    Past Surgical History:  Procedure Laterality Date  . NO PAST SURGERIES      OB History    Gravida  1   Para      Term      Preterm      AB      Living        SAB      TAB      Ectopic      Multiple      Live Births               Home Medications    Prior to Admission medications   Medication Sig Start Date End Date Taking? Authorizing Provider  norethindrone-ethinyl estradiol-iron (ESTROSTEP FE,TILIA FE,TRI-LEGEST FE) 1-20/1-30/1-35 MG-MCG tablet Take 1 tablet by mouth daily.   Yes [provider]  amoxicillin-clavulanate (AUGMENTIN) 875-125 MG tablet Take 1 tablet by mouth every 12 (twelve) hours. 09/01/19   Everlene Other G, DO    ipratropium (ATROVENT) 0.06 % nasal spray Place 2 sprays into both nostrils 4 (four) times daily as needed for rhinitis. 09/01/19   Tommie Sams, DO  ondansetron (ZOFRAN ODT) 4 MG disintegrating tablet Take 1 tablet (4 mg total) by mouth every 6 (six) hours as needed for nausea or vomiting. 09/17/19   Anson Oregon, PA-C    Family History Family History  Problem Relation Age of Onset  . Depression Mother   . Miscarriages / [place] Mother   . Cancer Maternal Grandmother   . Cancer Paternal Grandmother     Social History Social History   Tobacco Use  . Smoking status: Former Smoker    Types: Cigarettes  . Smokeless tobacco: Never Used  Substance Use Topics  . Alcohol use: No  . Drug use: No     Allergies   Patient has no known allergies.   Review of Systems Review of Systems  Constitutional: Positive for appetite change. Negative for fever.  Gastrointestinal: Positive for nausea and vomiting. Negative for abdominal distention, abdominal pain, blood in stool and diarrhea.  All other systems reviewed and are negative.  Physical  Exam Triage Vital Signs ED Triage Vitals  Enc Vitals Group     BP 09/17/19 1736 113/75     Pulse Rate 09/17/19 1736 77     Resp 09/17/19 1736 16     Temp 09/17/19 1736 98.1 F (36.7 C)     Temp Source 09/17/19 1736 Oral     SpO2 09/17/19 1736 100 %     Weight --      Height --      Head Circumference --      Peak Flow --      Pain Score 09/17/19 1733 0     Pain Loc --      Pain Edu? --      Excl. in [place]? --    No data found.  Updated Vital Signs BP 113/75 (BP Location: Left Arm)   Pulse 77   Temp 98.1 F (36.7 C) (Oral)   Resp 16   LMP 08/28/2019 (Approximate)   SpO2 100%   Visual Acuity Right Eye Distance:   Left Eye Distance:   Bilateral Distance:    Right Eye Near:   Left Eye Near:    Bilateral Near:     Physical Exam Constitutional:      General: She is not in acute distress.    Appearance: She is not  ill-appearing.  HENT:     Mouth/Throat:     Mouth: Mucous membranes are moist.     Pharynx: Oropharynx is clear.  Cardiovascular:     Rate and Rhythm: Normal rate and regular rhythm.     Heart sounds: No murmur. No friction rub. No gallop.   Pulmonary:     Effort: Pulmonary effort is normal. No respiratory distress.     Breath sounds: Normal breath sounds. No stridor. No wheezing or rhonchi.  Abdominal:     General: Abdomen is flat. Bowel sounds are normal. There is no distension.     Palpations: Abdomen is soft. There is no mass.     Tenderness: There is no abdominal tenderness. There is no right CVA tenderness, left CVA tenderness, guarding or rebound.     Hernia: No hernia is present.  Lymphadenopathy:     Cervical: No cervical adenopathy.  Neurological:     Mental Status: She is alert.    UC Treatments / Results  Labs (all labs ordered are listed, but only abnormal results are displayed) Labs Reviewed  PREGNANCY, URINE    EKG   Radiology No results found.  Procedures Procedures (including critical care time)  Medications Ordered in UC Medications - No data to display  Initial Impression / Assessment and Plan / UC Course  I have reviewed the triage vital signs and the nursing notes.  Pertinent labs & imaging results that were available during my care of the patient were reviewed by me and considered in my medical decision making (see chart for details).     1.  Treatment options were discussed today with the patient. 2.  Urine pregnancy test negative.  Nausea vomiting ongoing for less than 12 hours.  Likely viral in origin. 3.  Vitals are stable, she is not tachycardic or hypotensive.  Instructed the patient to stick with a bland diet over the next 24 hours.  Zofran sent in for the patient. 4.  Follow-up if no improvement of symptoms. Final Clinical Impressions(s) / UC Diagnoses   Final diagnoses:  Nausea  Nausea and vomiting, intractability of vomiting not  specified, unspecified vomiting type  Viral gastroenteritis  Discharge Instructions     -Drink plenty of water and fluids today.  Ginger ale but not soda if tolerated. Criss Rosales diet, soup, crackers. -Take anti-nausea medication as directed. -Follow-up if worsening.   ED Prescriptions    Medication Sig Dispense Auth. Provider   ondansetron (ZOFRAN ODT) 4 MG disintegrating tablet Take 1 tablet (4 mg total) by mouth every 6 (six) hours as needed for nausea or vomiting. 20 tablet Lattie Corns, PA-C     PDMP not reviewed this encounter.   Lattie Corns, [place] 09/17/19 [phone]

## 2019-10-18 ENCOUNTER — Ambulatory Visit (LOCAL_COMMUNITY_HEALTH_CENTER): Payer: Medicaid Other | Admitting: Physician Assistant

## 2019-10-18 ENCOUNTER — Other Ambulatory Visit: Payer: Self-pay

## 2019-10-18 ENCOUNTER — Encounter: Payer: Self-pay | Admitting: Physician Assistant

## 2019-10-18 VITALS — BP 102/66 | Ht 60.0 in | Wt 149.2 lb

## 2019-10-18 DIAGNOSIS — Z30013 Encounter for initial prescription of injectable contraceptive: Secondary | ICD-10-CM

## 2019-10-18 DIAGNOSIS — Z3009 Encounter for other general counseling and advice on contraception: Secondary | ICD-10-CM | POA: Diagnosis not present

## 2019-10-18 DIAGNOSIS — Z0000 Encounter for general adult medical examination without abnormal findings: Secondary | ICD-10-CM | POA: Diagnosis not present

## 2019-10-18 MED ORDER — MULTIVITAMINS PO CAPS: 1.0000 | ORAL_CAPSULE | Freq: Every day | ORAL | 0 refills | Status: AC

## 2019-10-18 MED ORDER — MEDROXYPROGESTERONE ACETATE 150 MG/ML IM SUSP
150.0000 mg | INTRAMUSCULAR | Status: AC
  Administered 2019-10-18 – 2020-07-08 (×4): 150 mg via INTRAMUSCULAR

## 2019-10-18 NOTE — Progress Notes (Signed)
Family Planning Visit- Initial Visit  Subjective:  Cassandra Erickson is a 29 y.o.  G1P1001   being seen today for an initial well woman visit and to discuss family planning options.  She is currently using none for pregnancy prevention. Patient reports she does not want a pregnancy in the next year.  Patient has the following medical conditions has First trimester screening; Vaginal bleeding; Vaginal bleeding in pregnancy, second trimester; and Indication for care in labor or delivery on their problem list.  Chief Complaint  Patient presents with  . Contraception    Patient reports that she was previously using Depo without problems.  Thinks that last pap was in 2017 and was normal but declines pap today.  Reports that she has "always" had migraines and uses Excedrin for relief.  Denies any increase or change in headaches.  Patient denies any chronic conditions, contributing family history and history of surgery.   Body mass index is 29.14 kg/m. - Patient is eligible for diabetes screening based on BMI and age >51?  not applicable ZG0F ordered? not applicable  Patient reports 1 of partners in last year. Desires STI screening?  No - .  Has patient been screened once for HCV in the past?  No  No results found for: HCVAB  Does the patient have current of drug use, have a partner with drug use, and/or has been incarcerated since last result? No  If yes-- Screen for HCV through [facility]   Does the patient meet criteria for HBV testing? No  Criteria:  -Household, sexual or needle sharing contact with HBV -History of drug use -HIV positive -Those with known Hep C   Health Maintenance Due  Topic Date Due  . COVID-19 Vaccine (1) Never done  . PAP-Cervical Cytology Screening  Never done  . PAP SMEAR-Modifier  Never done    Review of Systems  All other systems reviewed and are negative.   The following portions of the patient's history were reviewed and updated as  appropriate: allergies, current medications, past family history, past medical history, past social history, past surgical history and problem list. Problem list updated.   See flowsheet for other program required questions.  Objective:   Vitals:   10/18/19 1555  BP: 102/66  Weight: 149 lb 3.2 oz (67.7 kg)  Height: 5' (1.524 m)    Physical Exam Vitals and nursing note reviewed.  Constitutional:      General: She is not in acute distress.    Appearance: Normal appearance. She is normal weight.  HENT:     Head: Normocephalic and atraumatic.  Eyes:     Conjunctiva/sclera: Conjunctivae normal.  Neck:     Thyroid: No thyroid mass, thyromegaly or thyroid tenderness.  Cardiovascular:     Rate and Rhythm: Normal rate and regular rhythm.  Pulmonary:     Effort: Pulmonary effort is normal.     Breath sounds: Normal breath sounds.  Chest:     Breasts: Tanner Score is 5.        Right: Normal.        Left: Normal.  Abdominal:     Palpations: Abdomen is soft. There is no mass.     Tenderness: There is no abdominal tenderness. There is no guarding or rebound.  Musculoskeletal:     Cervical back: Neck supple. No tenderness.  Lymphadenopathy:     Cervical: No cervical adenopathy.     Upper Body:     Right upper body: No  supraclavicular, axillary or pectoral adenopathy.     Left upper body: No supraclavicular, axillary or pectoral adenopathy.  Skin:    General: Skin is warm and dry.     Findings: No bruising, erythema, lesion or rash.  Neurological:     Mental Status: She is alert and oriented to person, place, and time.  Psychiatric:        Mood and Affect: Mood normal.        Behavior: Behavior normal.        Thought Content: Thought content normal.        Judgment: Judgment normal.       Assessment and Plan:  Cassandra Erickson is a 29 y.o. female presenting to the [facility] Department for an initial well woman exam/family planning visit  Contraception  counseling: Reviewed all forms of birth control options in the tiered based approach. available including abstinence; over the counter/barrier methods; hormonal contraceptive medication including pill, patch, ring, injection,contraceptive implant, ECP; hormonal and nonhormonal IUDs; permanent sterilization options including vasectomy and the various tubal sterilization modalities. Risks, benefits, and typical effectiveness rates were reviewed.  Questions were answered.  Written information was also given to the patient to review.  Patient desires to start Depo , this was prescribed for patient. She will follow up in  3 months for Depo and pap and prn for surveillance.  She was told to call with any further questions, or with any concerns about this method of contraception.  Emphasized use of condoms 100% of the time for STI prevention.  Patient was not a candidate for ECP today.  1. Encounter for counseling regarding contraception Reviewed with patient risks, benefits, and SE of Depo and when to call clinic for irregular bleeding. Rec condoms with all sex for 2 weeks after shot is given today.   Rec that patient do an OTC pregnancy test in 1-2 weeks and RTC if positive. Enc condoms with all sex for STD protection.   2. Well woman exam (no gynecological exam) Reviewed with patient healthy habits for general health and maintaining normal BMI Enc MVI 1 po daily. Enc to establish with or follow up with PCP for illness or primary care concerns. Patient declines pap today and states that she will get at her next appointment.  3. Initiation of Depo Provera May have Depo 150 mg IM q 11-13 weeks for 1 year. - medroxyPROGESTERone (DEPO-PROVERA) injection 150 mg     No follow-ups on file.  No future appointments.  Matt Holmes, PA

## 2019-10-18 NOTE — Progress Notes (Addendum)
Here today for PE and Birth control. Interested in Depo. No records of PE's or Pap's here. Unsure of last PE or Pap. Per Epic last PE at [facility] was 03/29/2018. Tawny Hopping, RN

## 2019-10-18 NOTE — Progress Notes (Signed)
Depo given and tolerated well. MVI's accepted. Aware to schedule pelvic exam with next Depo appt. Tawny Hopping, RN

## 2019-10-19 ENCOUNTER — Encounter: Payer: Self-pay | Admitting: Physician Assistant

## 2019-10-25 ENCOUNTER — Encounter: Payer: Self-pay | Admitting: Physician Assistant

## 2019-10-28 ENCOUNTER — Ambulatory Visit
Admission: EM | Admit: 2019-10-28 | Discharge: 2019-10-28 | Disposition: A | Discharge status: Home / Self Care | Payer: Medicaid Other | Attending: Internal Medicine | Admitting: Internal Medicine

## 2019-10-28 ENCOUNTER — Other Ambulatory Visit: Payer: Self-pay

## 2019-10-28 ENCOUNTER — Encounter: Payer: Self-pay | Admitting: Emergency Medicine

## 2019-10-28 DIAGNOSIS — Z87891 Personal history of nicotine dependence: Secondary | ICD-10-CM | POA: Diagnosis not present

## 2019-10-28 DIAGNOSIS — J069 Acute upper respiratory infection, unspecified: Secondary | ICD-10-CM

## 2019-10-28 DIAGNOSIS — R05 Cough: Secondary | ICD-10-CM | POA: Diagnosis present

## 2019-10-28 DIAGNOSIS — Z20822 Contact with and (suspected) exposure to covid-19: Secondary | ICD-10-CM | POA: Diagnosis not present

## 2019-10-28 DIAGNOSIS — Z79899 Other long term (current) drug therapy: Secondary | ICD-10-CM | POA: Diagnosis not present

## 2019-10-28 MED ORDER — BENZONATATE 100 MG PO CAPS: 100.0000 mg | ORAL_CAPSULE | Freq: Three times a day (TID) | ORAL | 0 refills | Status: DC | PRN

## 2019-10-28 NOTE — ED Triage Notes (Signed)
Patient c/o cough and chest congestion for a week.  Patient denies fevers.  °

## 2019-10-28 NOTE — ED Provider Notes (Signed)
[facility]    CSN: [id number] Arrival date & time: 10/28/19  0919      History   Chief Complaint Chief Complaint  Patient presents with  . Cough    HPI Cassandra Erickson is a 29 y.o. female comes to the urgent care with 1 week history of nonproductive cough, nasal congestion and chest congestion.  No febrile episodes.  No nausea or vomiting.  Patient's daughter has similar symptoms.  Her oral intake is good.  No diarrhea.  No loss of taste or smell.  No shortness of breath or chest pain.  No dizziness, near syncope or syncopal episode.  No weakness.   Patient has seasonal allergies but does not currently take any medications.  HPI  Past Medical History:  Diagnosis Date  . GERD (gastroesophageal reflux disease)     Patient Active Problem List   Diagnosis Date Noted  . Indication for care in labor or delivery 07/02/2016  . Vaginal bleeding 03/08/2016  . Vaginal bleeding in pregnancy, second trimester 03/08/2016  . First trimester screening 12/29/2015    Past Surgical History:  Procedure Laterality Date  . NO PAST SURGERIES      OB History    Gravida  1   Para  1   Term  1   Preterm  0   AB  0   Living  1     SAB  0   TAB  0   Ectopic  0   Multiple  0   Live Births  1            Home Medications    Prior to Admission medications   Medication Sig Start Date End Date Taking? Authorizing Provider  Multiple Vitamin (MULTIVITAMIN) capsule Take 1 capsule by mouth daily for 100 doses. 10/18/19 01/26/20 Yes Hampton, Marylynn Pearson, PA  benzonatate (TESSALON) 100 MG capsule Take 1 capsule (100 mg total) by mouth 3 (three) times daily as needed for cough. 10/28/19   Dennette Faulconer, Britta Mccreedy, MD  ipratropium (ATROVENT) 0.06 % nasal spray Place 2 sprays into both nostrils 4 (four) times daily as needed for rhinitis. 09/01/19   Tommie Sams, DO  ondansetron (ZOFRAN ODT) 4 MG disintegrating tablet Take 1 tablet (4 mg total) by mouth every 6 (six) hours as needed  for nausea or vomiting. 09/17/19   Anson Oregon, PA-C  norethindrone-ethinyl estradiol-iron (ESTROSTEP FE,TILIA FE,TRI-LEGEST FE) 1-20/1-30/1-35 MG-MCG tablet Take 1 tablet by mouth daily.  10/28/19  [provider]    Family History Family History  Problem Relation Age of Onset  . Depression Mother   . Miscarriages / [place] Mother   . Cancer Maternal Grandmother   . Cancer Paternal Grandmother     Social History Social History   Tobacco Use  . Smoking status: Former Smoker    Types: Cigarettes  . Smokeless tobacco: Never Used  Vaping Use  . Vaping Use: Former  Substance Use Topics  . Alcohol use: No  . Drug use: No     Allergies   Patient has no known allergies.   Review of Systems Review of Systems  Constitutional: Negative for activity change, chills, fatigue and fever.  HENT: Positive for congestion and rhinorrhea. Negative for ear pain, mouth sores, postnasal drip and sore throat.   Eyes: Negative.   Respiratory: Positive for cough. Negative for chest tightness and shortness of breath.   Cardiovascular: Negative for chest pain and palpitations.  Gastrointestinal: Negative for abdominal pain, nausea and  vomiting.  Genitourinary: Negative.   Musculoskeletal: Negative.  Negative for arthralgias, joint swelling and myalgias.  Neurological: Negative for dizziness and light-headedness.     Physical Exam Triage Vital Signs ED Triage Vitals [10/28/19 0947]  Enc Vitals Group     BP      Pulse      Resp      Temp      Temp src      SpO2      Weight 145 lb (65.8 kg)     Height 4\' 11"  (1.499 m)     Head Circumference      Peak Flow      Pain Score 7     Pain Loc      Pain Edu?      Excl. in [place]?    No data found.  Updated Vital Signs Ht 4\' 11"  (1.499 m)   Wt 65.8 kg   LMP 10/05/2019 (Exact Date)   BMI 29.29 kg/m   Visual Acuity Right Eye Distance:   Left Eye Distance:   Bilateral Distance:    Right Eye Near:   Left Eye Near:      Bilateral Near:     Physical Exam Vitals and nursing note reviewed.  Constitutional:      Appearance: Normal appearance. She is not ill-appearing.  HENT:     Right Ear: Tympanic membrane normal.     Left Ear: Tympanic membrane normal.     Nose: Congestion present. No rhinorrhea.     Mouth/Throat:     Pharynx: Posterior oropharyngeal erythema present.  Eyes:     Conjunctiva/sclera: Conjunctivae normal.     Pupils: Pupils are equal, round, and reactive to light.  Cardiovascular:     Rate and Rhythm: Normal rate and regular rhythm.     Pulses: Normal pulses.     Heart sounds: Normal heart sounds.  Pulmonary:     Effort: Pulmonary effort is normal. No respiratory distress.     Breath sounds: No stridor. No wheezing, rhonchi or rales.  Abdominal:     General: Bowel sounds are normal.     Palpations: Abdomen is soft.  Skin:    Capillary Refill: Capillary refill takes less than 2 seconds.  Neurological:     Mental Status: She is alert.      UC Treatments / Results  Labs (all labs ordered are listed, but only abnormal results are displayed) Labs Reviewed  SARS CORONAVIRUS 2 (TAT 6-24 HRS)    EKG   Radiology No results found.  Procedures Procedures (including critical care time)  Medications Ordered in UC Medications - No data to display  Initial Impression / Assessment and Plan / UC Course  I have reviewed the triage vital signs and the nursing notes.  Pertinent labs & imaging results that were available during my care of the patient were reviewed by me and considered in my medical decision making (see chart for details).     1.  Viral URI with cough: Supportive care Tessalon Perles as needed for cough COVID-19 PCR test sent Patient is advised to self quarantine until COVID-19 test results are available Return precautions given Patient may consider starting medication for seasonal allergies. Final Clinical Impressions(s) / UC Diagnoses   Final diagnoses:   Viral URI with cough   Discharge Instructions   None    ED Prescriptions    Medication Sig Dispense Auth. Provider   benzonatate (TESSALON) 100 MG capsule Take 1 capsule (100 mg total) by  mouth 3 (three) times daily as needed for cough. 21 capsule Amitai Delaughter, Britta Mccreedy, MD     PDMP not reviewed this encounter.   Merrilee Jansky, MD 10/28/19 1040

## 2019-10-29 LAB — SARS CORONAVIRUS 2 (TAT 6-24 HRS): SARS Coronavirus 2: NEGATIVE

## 2020-01-10 ENCOUNTER — Other Ambulatory Visit: Payer: Self-pay

## 2020-01-10 ENCOUNTER — Ambulatory Visit: Payer: Medicaid Other

## 2020-01-10 VITALS — BP 115/73 | Ht 60.0 in | Wt 155.0 lb

## 2020-01-10 DIAGNOSIS — Z30013 Encounter for initial prescription of injectable contraceptive: Secondary | ICD-10-CM | POA: Diagnosis not present

## 2020-01-10 DIAGNOSIS — Z3009 Encounter for other general counseling and advice on contraception: Secondary | ICD-10-CM

## 2020-01-10 DIAGNOSIS — Z3042 Encounter for surveillance of injectable contraceptive: Secondary | ICD-10-CM

## 2020-01-10 NOTE — Progress Notes (Signed)
Pt is 12.0 weeks post depo today. DMPA 150 mg IM administered today per Sadie Haber, PA order dated 10/18/19 (1 year order).

## 2020-01-11 NOTE — Addendum Note (Signed)
Addended by: Tracey Harries on: 01/11/2020 03:00 PM   Modules accepted: Level of Service

## 2020-01-23 ENCOUNTER — Other Ambulatory Visit: Payer: Self-pay

## 2020-01-23 ENCOUNTER — Encounter: Payer: Self-pay | Admitting: Emergency Medicine

## 2020-01-23 ENCOUNTER — Ambulatory Visit
Admission: EM | Admit: 2020-01-23 | Discharge: 2020-01-23 | Disposition: A | Discharge status: Home / Self Care | Payer: HRSA Program | Attending: Family Medicine | Admitting: Family Medicine

## 2020-01-23 DIAGNOSIS — U071 COVID-19: Secondary | ICD-10-CM | POA: Insufficient documentation

## 2020-01-23 DIAGNOSIS — J988 Other specified respiratory disorders: Secondary | ICD-10-CM | POA: Insufficient documentation

## 2020-01-23 DIAGNOSIS — Z20822 Contact with and (suspected) exposure to covid-19: Secondary | ICD-10-CM | POA: Diagnosis present

## 2020-01-23 DIAGNOSIS — B9789 Other viral agents as the cause of diseases classified elsewhere: Secondary | ICD-10-CM

## 2020-01-23 LAB — SARS CORONAVIRUS 2 (TAT 6-24 HRS): SARS Coronavirus 2: POSITIVE — AB

## 2020-01-23 MED ORDER — NAPROXEN 500 MG PO TABS: 500.0000 mg | ORAL_TABLET | Freq: Two times a day (BID) | ORAL | 0 refills | Status: DC | PRN

## 2020-01-23 NOTE — Discharge Instructions (Signed)
Rest.  Medication as prescribed.  Stay home.  Take care  Dr. Adriana Simas

## 2020-01-23 NOTE — ED Provider Notes (Signed)
[facility]    CSN: [id number] Arrival date & time: 01/23/20  0947      History   Chief Complaint Chief Complaint  Patient presents with  . Cough  . Nasal Congestion  . Headache  . Chills   HPI  29 year old female presents with the above complaints.  Patient reports ongoing symptoms for the past 2 days.  She reports cough, runny nose, headache, subjective fever and chills.  No documented fever at home.  She states that she has been around her boyfriend who has been exposed to his parents who have tested positive for COVID-19.  Patient reports severe headache at this time.  No relieving factors.  No medications or interventions tried.  Patient is unvaccinated.  No other complaints.  Past Medical History:  Diagnosis Date  . GERD (gastroesophageal reflux disease)    Patient Active Problem List   Diagnosis Date Noted  . Indication for care in labor or delivery 07/02/2016  . Vaginal bleeding 03/08/2016  . Vaginal bleeding in pregnancy, second trimester 03/08/2016  . First trimester screening 12/29/2015   Past Surgical History:  Procedure Laterality Date  . NO PAST SURGERIES     OB History    Gravida  1   Para  1   Term  1   Preterm  0   AB  0   Living  1     SAB  0   TAB  0   Ectopic  0   Multiple  0   Live Births  1          Home Medications    Prior to Admission medications   Medication Sig Start Date End Date Taking? Authorizing Provider  Multiple Vitamin (MULTIVITAMIN) capsule Take 1 capsule by mouth daily for 100 doses. 10/18/19 01/26/20 Yes Hampton, Carla J, PA  ipratropium (ATROVENT) 0.06 % nasal spray Place 2 sprays into both nostrils 4 (four) times daily as needed for rhinitis. 09/01/19   Tommie Sams, DO  naproxen (NAPROSYN) 500 MG tablet Take 1 tablet (500 mg total) by mouth 2 (two) times daily as needed for moderate pain or headache. 01/23/20   Tommie Sams, DO  ondansetron (ZOFRAN ODT) 4 MG disintegrating tablet Take 1 tablet (4  mg total) by mouth every 6 (six) hours as needed for nausea or vomiting. 09/17/19   Anson Oregon, PA-C  norethindrone-ethinyl estradiol-iron (ESTROSTEP FE,TILIA FE,TRI-LEGEST FE) 1-20/1-30/1-35 MG-MCG tablet Take 1 tablet by mouth daily.  10/28/19  [provider]    Family History Family History  Problem Relation Age of Onset  . Depression Mother   . Miscarriages / [place] Mother   . Cancer Maternal Grandmother   . Cancer Paternal Grandmother   . Other Father        unknown medical history    Social History Social History   Tobacco Use  . Smoking status: Former Smoker    Types: Cigarettes    Quit date: 01/23/2016    Years since quitting: 4.0  . Smokeless tobacco: Never Used  Vaping Use  . Vaping Use: Former  . Quit date: 01/23/2016  Substance Use Topics  . Alcohol use: No  . Drug use: No     Allergies   Patient has no known allergies.   Review of Systems Review of Systems  Constitutional: Positive for chills and fever.  HENT: Positive for rhinorrhea.   Respiratory: Positive for cough.   Neurological: Positive for headaches.   Physical Exam Triage Vital  Signs ED Triage Vitals  Enc Vitals Group     BP 01/23/20 1057 107/82     Pulse Rate 01/23/20 1057 (!) 110     Resp 01/23/20 1057 18     Temp 01/23/20 1057 99.4 F (37.4 C)     Temp Source 01/23/20 1057 Oral     SpO2 01/23/20 1057 100 %     Weight 01/23/20 1059 145 lb (65.8 kg)     Height 01/23/20 1059 4\' 11"  (1.499 m)     Head Circumference --      Peak Flow --      Pain Score 01/23/20 1058 10     Pain Loc --      Pain Edu? --      Excl. in GC? --    Updated Vital Signs BP 107/82 (BP Location: Left Arm)   Pulse (!) 110   Temp 99.4 F (37.4 C) (Oral)   Resp 18   Ht 4\' 11"  (1.499 m)   Wt 65.8 kg   SpO2 100%   BMI 29.29 kg/m   Visual Acuity Right Eye Distance:   Left Eye Distance:   Bilateral Distance:    Right Eye Near:   Left Eye Near:    Bilateral Near:     Physical  Exam Vitals and nursing note reviewed.  Constitutional:      General: She is not in acute distress.    Appearance: Normal appearance. She is not ill-appearing.  HENT:     Head: Normocephalic and atraumatic.     Right Ear: Tympanic membrane normal.     Left Ear: Tympanic membrane normal.  Eyes:     General:        Right eye: No discharge.        Left eye: No discharge.     Conjunctiva/sclera: Conjunctivae normal.  Cardiovascular:     Rate and Rhythm: Regular rhythm. Tachycardia present.  Pulmonary:     Effort: Pulmonary effort is normal.     Breath sounds: Normal breath sounds. No wheezing, rhonchi or rales.  Neurological:     Mental Status: She is alert.  Psychiatric:        Mood and Affect: Mood normal.        Behavior: Behavior normal.    UC Treatments / Results  Labs (all labs ordered are listed, but only abnormal results are displayed) Labs Reviewed  SARS CORONAVIRUS 2 (TAT 6-24 HRS)    EKG   Radiology No results found.  Procedures Procedures (including critical care time)  Medications Ordered in UC Medications - No data to display  Initial Impression / Assessment and Plan / UC Course  I have reviewed the triage vital signs and the nursing notes.  Pertinent labs & imaging results that were available during my care of the patient were reviewed by me and considered in my medical decision making (see chart for details).    28 year old female presents with a viral respiratory infection.  Concern for COVID-19.  Naproxen as needed for body aches/headaches.  Supportive care.  Awaiting Covid test results.  Final Clinical Impressions(s) / UC Diagnoses   Final diagnoses:  Viral respiratory infection  Suspected COVID-19 virus infection     Discharge Instructions     Rest.  Medication as prescribed.  Stay home.  Take care  Dr.    ED Prescriptions    Medication Sig Dispense Auth. Provider   naproxen (NAPROSYN) 500 MG tablet Take 1 tablet (500 mg  total) by  mouth 2 (two) times daily as needed for moderate pain or headache. 30 tablet Tommie Sams, DO     PDMP not reviewed this encounter.   Tommie Sams, DO 01/23/20 1329

## 2020-01-23 NOTE — ED Triage Notes (Signed)
Patient in today c/o cough, runny nose, headache and chills x 2 days. Patient has not taken any OTC medications for her symptoms. Patient has not had the covid vaccine.

## 2020-01-24 ENCOUNTER — Telehealth (HOSPITAL_COMMUNITY): Payer: Self-pay | Admitting: Nurse Practitioner

## 2020-01-24 ENCOUNTER — Encounter: Payer: Self-pay | Admitting: Nurse Practitioner

## 2020-01-24 DIAGNOSIS — U071 COVID-19: Secondary | ICD-10-CM

## 2020-01-24 NOTE — Telephone Encounter (Signed)
Called to discuss with Bari Mantis about Covid symptoms and the use of regeneron, a monoclonal antibody infusion for those with mild to moderate Covid symptoms and at a high risk of hospitalization.     Pt is qualified for this infusion at the [facility] center due to co-morbid conditions and/or a member of an at-risk group, however declines infusion at this time. Attempted to review symptom tier as well as criteria for ending isolation, symptoms that would warrant ER/Hospitalization, Prevention and encourage vaccination however, patient declined and said 'I feel fine'.     Patient Active Problem List   Diagnosis Date Noted  . Indication for care in labor or delivery 07/02/2016  . Vaginal bleeding 03/08/2016  . Vaginal bleeding in pregnancy, second trimester 03/08/2016  . First trimester screening 12/29/2015     Consuello Masse, NP [facility]  [phone] [email or web address]

## 2020-04-16 ENCOUNTER — Other Ambulatory Visit: Payer: Self-pay

## 2020-04-16 ENCOUNTER — Ambulatory Visit (LOCAL_COMMUNITY_HEALTH_CENTER): Payer: Medicaid Other

## 2020-04-16 VITALS — BP 97/66 | Ht 60.0 in | Wt 162.0 lb

## 2020-04-16 DIAGNOSIS — Z3042 Encounter for surveillance of injectable contraceptive: Secondary | ICD-10-CM

## 2020-04-16 DIAGNOSIS — Z3009 Encounter for other general counseling and advice on contraception: Secondary | ICD-10-CM

## 2020-04-16 DIAGNOSIS — Z30013 Encounter for initial prescription of injectable contraceptive: Secondary | ICD-10-CM | POA: Diagnosis not present

## 2020-04-16 NOTE — Progress Notes (Signed)
Pt is 13.6 weeks post depo today. DMPA 150 mg IM administered per Sadie Haber, PA order dated 10/18/19.

## 2020-07-08 ENCOUNTER — Ambulatory Visit (LOCAL_COMMUNITY_HEALTH_CENTER): Payer: Medicaid Other

## 2020-07-08 ENCOUNTER — Other Ambulatory Visit: Payer: Self-pay

## 2020-07-08 VITALS — BP 110/76 | HR 82 | Ht 60.0 in | Wt 164.5 lb

## 2020-07-08 DIAGNOSIS — Z3009 Encounter for other general counseling and advice on contraception: Secondary | ICD-10-CM

## 2020-07-08 DIAGNOSIS — Z30013 Encounter for initial prescription of injectable contraceptive: Secondary | ICD-10-CM

## 2020-07-08 NOTE — Progress Notes (Signed)
Depo administered per 10/18/2019 written order of Sadie Haber PA and client tolerated without complaint. Jossie Ng, RN

## 2020-07-31 ENCOUNTER — Encounter: Payer: Self-pay | Admitting: Emergency Medicine

## 2020-07-31 ENCOUNTER — Ambulatory Visit
Admission: EM | Admit: 2020-07-31 | Discharge: 2020-07-31 | Disposition: A | Discharge status: Home / Self Care | Payer: Medicaid Other | Attending: Family Medicine | Admitting: Family Medicine

## 2020-07-31 ENCOUNTER — Other Ambulatory Visit: Payer: Self-pay

## 2020-07-31 DIAGNOSIS — K529 Noninfective gastroenteritis and colitis, unspecified: Secondary | ICD-10-CM | POA: Insufficient documentation

## 2020-07-31 DIAGNOSIS — Z87891 Personal history of nicotine dependence: Secondary | ICD-10-CM | POA: Insufficient documentation

## 2020-07-31 DIAGNOSIS — Z20822 Contact with and (suspected) exposure to covid-19: Secondary | ICD-10-CM | POA: Insufficient documentation

## 2020-07-31 DIAGNOSIS — R059 Cough, unspecified: Secondary | ICD-10-CM | POA: Insufficient documentation

## 2020-07-31 DIAGNOSIS — R509 Fever, unspecified: Secondary | ICD-10-CM | POA: Insufficient documentation

## 2020-07-31 LAB — RESP PANEL BY RT-PCR (FLU A&B, COVID) ARPGX2
Influenza A by PCR: NEGATIVE
Influenza B by PCR: NEGATIVE
SARS Coronavirus 2 by RT PCR: NEGATIVE

## 2020-07-31 NOTE — ED Provider Notes (Signed)
[facility]    CSN: [id number] Arrival date & time: 07/31/20  1009      History   Chief Complaint Chief Complaint  Patient presents with  . Fever   HPI  30 year old female presents for evaluation of fever.  Patient reports her symptoms started on Tuesday.  She reports nausea, vomiting, diarrhea.  Also reports fever.  She states that her fever has continued to persist.  She states that it is not improving.  She states that her temperature yesterday was 100.3.  Patient states that she is taking Tylenol without significant improvement although her temperature is currently 99.1.  She states that it will not go below 99.  She states that nausea, vomiting, and diarrhea have improved.  She reports some cough.  No other respiratory symptoms.  She does report some abdominal pain as well.  No reported sick contacts.  No other complaints.  Past Medical History:  Diagnosis Date  . GERD (gastroesophageal reflux disease)    Patient Active Problem List   Diagnosis Date Noted  . Indication for care in labor or delivery 07/02/2016  . Vaginal bleeding 03/08/2016  . Vaginal bleeding in pregnancy, second trimester 03/08/2016  . First trimester screening 12/29/2015    Past Surgical History:  Procedure Laterality Date  . NO PAST SURGERIES      OB History    Gravida  1   Para  1   Term  1   Preterm  0   AB  0   Living  1     SAB  0   IAB  0   Ectopic  0   Multiple  0   Live Births  1            Home Medications    Prior to Admission medications   Medication Sig Start Date End Date Taking? Authorizing Provider  Multiple Vitamins-Minerals (MULTIVITAMIN WITH MINERALS) tablet Take 1 tablet by mouth daily.   Yes [provider]  ipratropium (ATROVENT) 0.06 % nasal spray Place 2 sprays into both nostrils 4 (four) times daily as needed for rhinitis. 09/01/19   Tommie Sams, DO  ondansetron (ZOFRAN ODT) 4 MG disintegrating tablet Take 1 tablet (4 mg  total) by mouth every 6 (six) hours as needed for nausea or vomiting. 09/17/19   Anson Oregon, PA-C  norethindrone-ethinyl estradiol-iron (ESTROSTEP FE,TILIA FE,TRI-LEGEST FE) 1-20/1-30/1-35 MG-MCG tablet Take 1 tablet by mouth daily.  10/28/19  [provider]    Family History Family History  Problem Relation Age of Onset  . Depression Mother   . Miscarriages / [place] Mother   . Cancer Maternal Grandmother   . Cancer Paternal Grandmother   . Other Father        unknown medical history    Social History Social History   Tobacco Use  . Smoking status: Former Smoker    Types: Cigarettes    Quit date: 01/23/2016    Years since quitting: 4.5  . Smokeless tobacco: Never Used  Vaping Use  . Vaping Use: Former  . Quit date: 01/23/2016  Substance Use Topics  . Alcohol use: No  . Drug use: No     Allergies   Patient has no known allergies.   Review of Systems Review of Systems  Constitutional: Positive for fever.  Respiratory: Positive for cough.   Gastrointestinal: Positive for abdominal pain, diarrhea, nausea and vomiting.   Physical Exam Triage Vital Signs ED Triage Vitals  Enc Vitals Group  BP 07/31/20 1044 112/81     Pulse Rate 07/31/20 1044 73     Resp 07/31/20 1044 18     Temp 07/31/20 1044 99.1 F (37.3 C)     Temp Source 07/31/20 1044 Oral     SpO2 07/31/20 1044 100 %     Weight 07/31/20 1042 160 lb (72.6 kg)     Height 07/31/20 1042 5' (1.524 m)     Head Circumference --      Peak Flow --      Pain Score 07/31/20 1042 0     Pain Loc --      Pain Edu? --      Excl. in GC? --    Updated Vital Signs BP 112/81 (BP Location: Left Arm)   Pulse 73   Temp 99.1 F (37.3 C) (Oral)   Resp 18   Ht 5' (1.524 m)   Wt 72.6 kg   SpO2 100%   BMI 31.25 kg/m   Visual Acuity Right Eye Distance:   Left Eye Distance:   Bilateral Distance:    Right Eye Near:   Left Eye Near:    Bilateral Near:     Physical Exam Vitals and nursing note  reviewed.  Constitutional:      General: She is not in acute distress.    Appearance: Normal appearance. She is obese. She is not ill-appearing.  HENT:     Head: Normocephalic and atraumatic.  Eyes:     General:        Right eye: No discharge.        Left eye: No discharge.     Conjunctiva/sclera: Conjunctivae normal.  Cardiovascular:     Rate and Rhythm: Normal rate and regular rhythm.     Heart sounds: No murmur heard.   Pulmonary:     Effort: Pulmonary effort is normal.     Breath sounds: Normal breath sounds. No wheezing, rhonchi or rales.  Abdominal:     General: There is no distension.     Palpations: Abdomen is soft.     Tenderness: There is no abdominal tenderness.  Neurological:     Mental Status: She is alert.  Psychiatric:     Comments: Flat affect.    UC Treatments / Results  Labs (all labs ordered are listed, but only abnormal results are displayed) Labs Reviewed  RESP PANEL BY RT-PCR (FLU A&B, COVID) [id number]    EKG   Radiology No results found.  Procedures Procedures (including critical care time)  Medications Ordered in UC Medications - No data to display  Initial Impression / Assessment and Plan / UC Course  I have reviewed the triage vital signs and the nursing notes.  Pertinent labs & imaging results that were available during my care of the patient were reviewed by me and considered in my medical decision making (see chart for details).    30 year old female presents with gastroenteritis.  Still having low-grade temperatures.  Currently afebrile.  Flu and Covid negative.  Advised Tylenol and ibuprofen as directed.  Supportive care.  Final Clinical Impressions(s) / UC Diagnoses   Final diagnoses:  Gastroenteritis     Discharge Instructions     Flu and COVID negative.  This is due to the gastroenteritis.  Tylenol 1000 mg 3 times daily as needed. Ibuprofen 800 mg 3 times daily as needed.  Take care  Dr. Adriana Simas     ED  Prescriptions    None     PDMP not reviewed  this encounter.   Tommie Sams, DO 07/31/20 1213

## 2020-07-31 NOTE — Discharge Instructions (Signed)
Flu and COVID negative.  This is due to the gastroenteritis.  Tylenol 1000 mg 3 times daily as needed. Ibuprofen 800 mg 3 times daily as needed.  Take care  Dr. Adriana Simas

## 2020-07-31 NOTE — ED Triage Notes (Signed)
Patient reports vomiting and diarrhea that started on Tuesday and lasted 24 hours. She reports she had a fever that started yesterday of 100.3.

## 2021-03-27 ENCOUNTER — Other Ambulatory Visit: Payer: Self-pay

## 2021-03-27 ENCOUNTER — Ambulatory Visit
Admission: EM | Admit: 2021-03-27 | Discharge: 2021-03-27 | Disposition: A | Discharge status: Home / Self Care | Payer: Medicaid Other

## 2021-03-27 ENCOUNTER — Encounter: Payer: Self-pay | Admitting: Licensed Clinical Social Worker

## 2021-03-27 DIAGNOSIS — R509 Fever, unspecified: Secondary | ICD-10-CM

## 2021-03-27 DIAGNOSIS — R051 Acute cough: Secondary | ICD-10-CM

## 2021-03-27 DIAGNOSIS — J09X2 Influenza due to identified novel influenza A virus with other respiratory manifestations: Secondary | ICD-10-CM

## 2021-03-27 DIAGNOSIS — R0981 Nasal congestion: Secondary | ICD-10-CM

## 2021-03-27 LAB — RAPID INFLUENZA A&B ANTIGENS
Influenza A (ARMC): POSITIVE — AB
Influenza B (ARMC): NEGATIVE

## 2021-03-27 MED ORDER — OSELTAMIVIR PHOSPHATE 75 MG PO CAPS: 75.0000 mg | ORAL_CAPSULE | Freq: Two times a day (BID) | ORAL | 0 refills | Status: DC

## 2021-03-27 MED ORDER — PREDNISONE 10 MG (21) PO TBPK: ORAL_TABLET | Freq: Every day | ORAL | 0 refills | Status: DC

## 2021-03-27 NOTE — Discharge Instructions (Addendum)
Your symptoms with linger for several days such as the cough  Take otc motrin or tylenol for fever or pain  If symptoms become worse go to er  Take OTC medications such as dayquil

## 2021-03-27 NOTE — ED Triage Notes (Signed)
Pt c/o cough, congestion, headache, temp 101.6 last night. Sxs x 1 day.

## 2021-03-27 NOTE — ED Provider Notes (Addendum)
[facility]    CSN: [id number] Arrival date & time: 03/27/21  0806      History   Chief Complaint Chief Complaint  Patient presents with   Cough   Headache    HPI Cassandra Erickson is a 30 y.o. female.   Pt is here for cough congestion and fever since last night. Here fever was 101 last night. Took tylenol and it was better. Denies any n/v/d. No one at home was sick.    Past Medical History:  Diagnosis Date   GERD (gastroesophageal reflux disease)     Patient Active Problem List   Diagnosis Date Noted   Indication for care in labor or delivery 07/02/2016   Vaginal bleeding 03/08/2016   Vaginal bleeding in pregnancy, second trimester 03/08/2016   First trimester screening 12/29/2015    Past Surgical History:  Procedure Laterality Date   NO PAST SURGERIES      OB History     Gravida  1   Para  1   Term  1   Preterm  0   AB  0   Living  1      SAB  0   IAB  0   Ectopic  0   Multiple  0   Live Births  1            Home Medications    Prior to Admission medications   Medication Sig Start Date End Date Taking? Authorizing Provider  oseltamivir (TAMIFLU) 75 MG capsule Take 1 capsule (75 mg total) by mouth every 12 (twelve) hours. 03/27/21  Yes Maple Mirza L, NP  PARoxetine (PAXIL) 30 MG tablet Take by mouth. 02/16/21 02/16/22 Yes [provider]  predniSONE (STERAPRED UNI-PAK 21 TAB) 10 MG (21) TBPK tablet Take by mouth daily. Take 6 tabs by mouth daily  for 2 days, then 5 tabs for 2 days, then 4 tabs for 2 days, then 3 tabs for 2 days, 2 tabs for 2 days, then 1 tab by mouth daily for 2 days 03/27/21  Yes Maple Mirza L, NP  ipratropium (ATROVENT) 0.06 % nasal spray Place 2 sprays into both nostrils 4 (four) times daily as needed for rhinitis. 09/01/19   Tommie Sams, DO  Multiple Vitamins-Minerals (MULTIVITAMIN WITH MINERALS) tablet Take 1 tablet by mouth daily.    [provider]  ondansetron (ZOFRAN  ODT) 4 MG disintegrating tablet Take 1 tablet (4 mg total) by mouth every 6 (six) hours as needed for nausea or vomiting. 09/17/19   Anson Oregon, PA-C  norethindrone-ethinyl estradiol-iron (ESTROSTEP FE,TILIA FE,TRI-LEGEST FE) 1-20/1-30/1-35 MG-MCG tablet Take 1 tablet by mouth daily.  10/28/19  [provider]    Family History Family History  Problem Relation Age of Onset   Depression Mother    Miscarriages / [place] Mother    Cancer Maternal Grandmother    Cancer Paternal Grandmother    Other Father        unknown medical history    Social History Social History   Tobacco Use   Smoking status: Former    Types: Cigarettes    Quit date: 01/23/2016    Years since quitting: 5.1   Smokeless tobacco: Never  Vaping Use   Vaping Use: Former   Quit date: 01/23/2016  Substance Use Topics   Alcohol use: No   Drug use: No     Allergies   Patient has no known allergies.   Review of Systems Review of Systems  Constitutional:  Positive for fever.  HENT:  Positive for congestion, postnasal drip, rhinorrhea, sinus pressure, sinus pain, sneezing and sore throat.   Respiratory:  Positive for cough and shortness of breath. Negative for wheezing.   Cardiovascular: Negative.   Gastrointestinal:  Negative for abdominal distention and abdominal pain.  Genitourinary: Negative.   Skin: Negative.   Neurological: Negative.     Physical Exam Triage Vital Signs ED Triage Vitals  Enc Vitals Group     BP 03/27/21 0816 108/69     Pulse Rate 03/27/21 0816 (!) 111     Resp 03/27/21 0816 16     Temp 03/27/21 0816 99.4 F (37.4 C)     Temp Source 03/27/21 0816 Oral     SpO2 03/27/21 0816 100 %     Weight 03/27/21 0813 155 lb (70.3 kg)     Height 03/27/21 0813 4\' 11"  (1.499 m)     Head Circumference --      Peak Flow --      Pain Score 03/27/21 0813 6     Pain Loc --      Pain Edu? --      Excl. in GC? --    No data found.  Updated Vital Signs BP 108/69 (BP  Location: Left Arm)   Pulse (!) 111   Temp 99.4 F (37.4 C) (Oral)   Resp 16   Ht 4\' 11"  (1.499 m)   Wt 155 lb (70.3 kg)   SpO2 100%   BMI 31.31 kg/m   Visual Acuity Right Eye Distance:   Left Eye Distance:   Bilateral Distance:    Right Eye Near:   Left Eye Near:    Bilateral Near:     Physical Exam HENT:     Mouth/Throat:     Mouth: Mucous membranes are moist.  Eyes:     Pupils: Pupils are equal, round, and reactive to light.  Cardiovascular:     Rate and Rhythm: Normal rate.  Pulmonary:     Effort: Pulmonary effort is normal.     Breath sounds: Normal breath sounds.  Abdominal:     Palpations: Abdomen is soft.  Musculoskeletal:     Cervical back: Normal range of motion.  Skin:    General: Skin is warm.  Neurological:     Mental Status: She is alert.     UC Treatments / Results  Labs (all labs ordered are listed, but only abnormal results are displayed) Labs Reviewed  RAPID INFLUENZA A&B ANTIGENS - Abnormal; Notable for the following components:      Result Value   Influenza A ([facility]) POSITIVE (*)    All other components within normal limits    EKG   Radiology No results found.  Procedures Procedures (including critical care time)  Medications Ordered in UC Medications - No data to display  Initial Impression / Assessment and Plan / UC Course  I have reviewed the triage vital signs and the nursing notes.  Pertinent labs & imaging results that were available during my care of the patient were reviewed by me and considered in my medical decision making (see chart for details).     Your symptoms with linger for several days such as the cough  Take otc motrin or tylenol for fever or pain  If symptoms become worse go to er  Take OTC medications such as dayquil  Flu test positive  Final Clinical Impressions(s) / UC Diagnoses   Final diagnoses:  Acute cough  Fever,  unspecified  Nasal congestion  Influenza due to identified novel influenza A  virus with other respiratory manifestations     Discharge Instructions      Your symptoms with linger for several days such as the cough  Take otc motrin or tylenol for fever or pain  If symptoms become worse go to er  Take OTC medications such as dayquil       ED Prescriptions     Medication Sig Dispense Auth. Provider   predniSONE (STERAPRED UNI-PAK 21 TAB) 10 MG (21) TBPK tablet Take by mouth daily. Take 6 tabs by mouth daily  for 2 days, then 5 tabs for 2 days, then 4 tabs for 2 days, then 3 tabs for 2 days, 2 tabs for 2 days, then 1 tab by mouth daily for 2 days 42 tablet Maple Mirza L, NP   oseltamivir (TAMIFLU) 75 MG capsule Take 1 capsule (75 mg total) by mouth every 12 (twelve) hours. 10 capsule Coralyn Mark, NP      PDMP not reviewed this encounter.   Coralyn Mark, NP 03/27/21 0846    Coralyn Mark, NP 03/27/21 [phone]

## 2021-06-29 ENCOUNTER — Other Ambulatory Visit: Payer: Self-pay

## 2021-06-29 ENCOUNTER — Telehealth: Payer: Self-pay | Admitting: Emergency Medicine

## 2021-06-29 ENCOUNTER — Ambulatory Visit
Admission: RE | Admit: 2021-06-29 | Discharge: 2021-06-29 | Disposition: A | Discharge status: Home / Self Care | Payer: Medicaid Other | Source: Ambulatory Visit | Attending: Internal Medicine | Admitting: Internal Medicine

## 2021-06-29 VITALS — BP 112/70 | HR 71 | Temp 98.4°F | Resp 18 | Ht 60.0 in | Wt 150.0 lb

## 2021-06-29 DIAGNOSIS — R058 Other specified cough: Secondary | ICD-10-CM

## 2021-06-29 MED ORDER — BENZONATATE 100 MG PO CAPS: 200.0000 mg | ORAL_CAPSULE | Freq: Three times a day (TID) | ORAL | 0 refills | Status: DC | PRN

## 2021-06-29 NOTE — Discharge Instructions (Signed)
Please continue humidifier use Saline nasal spray will help with nasal congestion Take medications as prescribed Return to urgent care if symptoms worsen.

## 2021-06-29 NOTE — ED Triage Notes (Signed)
Pt c/o cough and nasal congestion x2weeks.  °

## 2021-06-29 NOTE — ED Provider Notes (Signed)
[facility]    CSN: [id number] Arrival date & time: 06/29/21  1643      History   Chief Complaint Chief Complaint  Patient presents with   Cough   Nasal Congestion    HPI Cassandra Erickson is a 31 y.o. female comes to urgent care with a 2-week history of nasal congestion and a nonproductive cough.  Patient says symptoms started a couple of weeks ago and has been persistent.  She denies fever or chills.  No shortness of breath or chest tightness.  No history of tobacco use.  Positive sick contact with similar symptoms.  No sore throat.  No dizziness, near syncope or syncopal episodes.  Patient is not vaccinated against COVID.  She has had COVID-19 infection on 2 occasions.   HPI  Past Medical History:  Diagnosis Date   GERD (gastroesophageal reflux disease)     Patient Active Problem List   Diagnosis Date Noted   Indication for care in labor or delivery 07/02/2016   Vaginal bleeding 03/08/2016   Vaginal bleeding in pregnancy, second trimester 03/08/2016   First trimester screening 12/29/2015    Past Surgical History:  Procedure Laterality Date   NO PAST SURGERIES      OB History     Gravida  1   Para  1   Term  1   Preterm  0   AB  0   Living  1      SAB  0   IAB  0   Ectopic  0   Multiple  0   Live Births  1            Home Medications    Prior to Admission medications   Medication Sig Start Date End Date Taking? Authorizing Provider  Multiple Vitamins-Minerals (MULTIVITAMIN WITH MINERALS) tablet Take 1 tablet by mouth daily.   Yes [provider]  PARoxetine (PAXIL) 30 MG tablet Take by mouth. 02/16/21 02/16/22 Yes [provider]  predniSONE (STERAPRED UNI-PAK 21 TAB) 10 MG (21) TBPK tablet Take by mouth daily. Take 6 tabs by mouth daily  for 2 days, then 5 tabs for 2 days, then 4 tabs for 2 days, then 3 tabs for 2 days, 2 tabs for 2 days, then 1 tab by mouth daily for 2 days 03/27/21  Yes Coralyn Mark,  NP  benzonatate (TESSALON) 100 MG capsule Take 2 capsules (200 mg total) by mouth 3 (three) times daily as needed for cough. 06/29/21   Cassandra Erickson, Britta Mccreedy, MD  norethindrone-ethinyl estradiol-iron (ESTROSTEP FE,TILIA FE,TRI-LEGEST FE) 1-20/1-30/1-35 MG-MCG tablet Take 1 tablet by mouth daily.  10/28/19  [provider]    Family History Family History  Problem Relation Age of Onset   Depression Mother    Miscarriages / [place] Mother    Cancer Maternal Grandmother    Cancer Paternal Grandmother    Other Father        unknown medical history    Social History Social History   Tobacco Use   Smoking status: Former    Types: Cigarettes    Quit date: 01/23/2016    Years since quitting: 5.4   Smokeless tobacco: Never  Vaping Use   Vaping Use: Former   Quit date: 01/23/2016  Substance Use Topics   Alcohol use: No   Drug use: No     Allergies   Patient has no known allergies.   Review of Systems Review of Systems  HENT:  Positive for congestion  and postnasal drip. Negative for sinus pressure and sinus pain.   Respiratory:  Positive for cough. Negative for shortness of breath and wheezing.   Cardiovascular:  Negative for chest pain.  Gastrointestinal: Negative.   Neurological: Negative.     Physical Exam Triage Vital Signs ED Triage Vitals  Enc Vitals Group     BP 06/29/21 1756 112/70     Pulse Rate 06/29/21 1756 71     Resp 06/29/21 1756 18     Temp 06/29/21 1756 98.4 F (36.9 C)     Temp Source 06/29/21 1756 Oral     SpO2 06/29/21 1756 100 %     Weight 06/29/21 1755 150 lb (68 kg)     Height 06/29/21 1755 5' (1.524 m)     Head Circumference --      Peak Flow --      Pain Score 06/29/21 1754 0     Pain Loc --      Pain Edu? --      Excl. in [place]? --    No data found.  Updated Vital Signs BP 112/70 (BP Location: Left Arm)    Pulse 71    Temp 98.4 F (36.9 C) (Oral)    Resp 18    Ht 5' (1.524 m)    Wt 68 kg    LMP  (LMP Unknown)    SpO2 100%    BMI  29.29 kg/m   Visual Acuity Right Eye Distance:   Left Eye Distance:   Bilateral Distance:    Right Eye Near:   Left Eye Near:    Bilateral Near:     Physical Exam Vitals and nursing note reviewed.  Constitutional:      Appearance: Normal appearance.  Cardiovascular:     Rate and Rhythm: Normal rate and regular rhythm.     Pulses: Normal pulses.     Heart sounds: Normal heart sounds.  Pulmonary:     Effort: Pulmonary effort is normal.     Breath sounds: Normal breath sounds.  Neurological:     Mental Status: She is alert.     UC Treatments / Results  Labs (all labs ordered are listed, but only abnormal results are displayed) Labs Reviewed - No data to display  EKG   Radiology No results found.  Procedures Procedures (including critical care time)  Medications Ordered in UC Medications - No data to display  Initial Impression / Assessment and Plan / UC Course  I have reviewed the triage vital signs and the nursing notes.  Pertinent labs & imaging results that were available during my care of the patient were reviewed by me and considered in my medical decision making (see chart for details).     1.  Postviral cough syndrome: Tessalon Perles as needed for cough Humidifier use Maintain adequate hydration Saline nasal spray for nasal congestion Return to urgent care if you have any further concerns. Final Clinical Impressions(s) / UC Diagnoses   Final diagnoses:  Post-viral cough syndrome     Discharge Instructions      Please continue humidifier use Saline nasal spray will help with nasal congestion Take medications as prescribed Return to urgent care if symptoms worsen.   ED Prescriptions     Medication Sig Dispense Auth. Provider   benzonatate (TESSALON) 100 MG capsule Take 2 capsules (200 mg total) by mouth 3 (three) times daily as needed for cough. 30 capsule Braxley Balandran, Britta Mccreedy, MD      PDMP not reviewed  this encounter.   Merrilee Jansky, MD 06/29/21 1843

## 2021-06-29 NOTE — Telephone Encounter (Signed)
Medication sent to wrong pharmacy. Resent to correct pharmacy.

## 2022-07-12 ENCOUNTER — Other Ambulatory Visit: Payer: Self-pay

## 2022-07-12 ENCOUNTER — Ambulatory Visit
Admission: EM | Admit: 2022-07-12 | Discharge: 2022-07-12 | Disposition: A | Discharge status: Home / Self Care | Payer: 59 | Attending: Physician Assistant | Admitting: Physician Assistant

## 2022-07-12 DIAGNOSIS — R051 Acute cough: Secondary | ICD-10-CM

## 2022-07-12 DIAGNOSIS — J029 Acute pharyngitis, unspecified: Secondary | ICD-10-CM | POA: Diagnosis not present

## 2022-07-12 DIAGNOSIS — Z87891 Personal history of nicotine dependence: Secondary | ICD-10-CM | POA: Diagnosis not present

## 2022-07-12 DIAGNOSIS — J069 Acute upper respiratory infection, unspecified: Secondary | ICD-10-CM | POA: Diagnosis not present

## 2022-07-12 DIAGNOSIS — K219 Gastro-esophageal reflux disease without esophagitis: Secondary | ICD-10-CM | POA: Diagnosis not present

## 2022-07-12 DIAGNOSIS — Z1152 Encounter for screening for COVID-19: Secondary | ICD-10-CM | POA: Insufficient documentation

## 2022-07-12 LAB — SARS CORONAVIRUS 2 BY RT PCR: SARS Coronavirus 2 by RT PCR: NEGATIVE

## 2022-07-12 LAB — GROUP A STREP BY PCR: Group A Strep by PCR: NOT DETECTED

## 2022-07-12 MED ORDER — LIDOCAINE VISCOUS HCL 2 % MT SOLN: 15.0000 mL | OROMUCOSAL | 0 refills | Status: DC | PRN

## 2022-07-12 MED ORDER — PROMETHAZINE-DM 6.25-15 MG/5ML PO SYRP: 5.0000 mL | ORAL_SOLUTION | Freq: Four times a day (QID) | ORAL | 0 refills | Status: DC | PRN

## 2022-07-12 NOTE — Discharge Instructions (Signed)
URI/COLD SYMPTOMS: Your exam today is consistent with a viral illness. Antibiotics are not indicated at this time. Use medications as directed, including cough syrup, nasal saline, and decongestants. Your symptoms should improve over the next few days and resolve within 7-10 days. Increase rest and fluids. F/u if symptoms worsen or predominate such as sore throat, ear pain, productive cough, shortness of breath, or if you develop high fevers or worsening fatigue over the next several days.    

## 2022-07-12 NOTE — ED Provider Notes (Signed)
[facility]    CSN: [id number] Arrival date & time: 07/12/22  [id number]      History   Chief Complaint Chief Complaint  Patient presents with   Sore Throat   Generalized Body Aches   Cough    HPI Cassandra Erickson is a 32 y.o. female presenting for approximately 3-day history of sore throat, cough, headache and bodyaches.  Denies fever, breathing difficulty, vomiting or diarrhea.  Reports that her stepdaughter is sick with similar symptoms and being seen today as well.  Denies any known COVID, flu or strep exposure.  Has been taking over-the-counter cough medication without relief.  No other complaints.  HPI  Past Medical History:  Diagnosis Date   GERD (gastroesophageal reflux disease)     Patient Active Problem List   Diagnosis Date Noted   Indication for care in labor or delivery 07/02/2016   Vaginal bleeding 03/08/2016   Vaginal bleeding in pregnancy, second trimester 03/08/2016   First trimester screening 12/29/2015    Past Surgical History:  Procedure Laterality Date   NO PAST SURGERIES      OB History     Gravida  1   Para  1   Term  1   Preterm  0   AB  0   Living  1      SAB  0   IAB  0   Ectopic  0   Multiple  0   Live Births  1            Home Medications    Prior to Admission medications   Medication Sig Start Date End Date Taking? Authorizing Provider  lidocaine (XYLOCAINE) 2 % solution Use as directed 15 mLs in the mouth or throat every 3 (three) hours as needed for mouth pain (swish and spit). 07/12/22  Yes Danton Clap, PA-C  promethazine-dextromethorphan (PROMETHAZINE-DM) 6.25-15 MG/5ML syrup Take 5 mLs by mouth 4 (four) times daily as needed. 07/12/22  Yes Danton Clap, PA-C  Multiple Vitamins-Minerals (MULTIVITAMIN WITH MINERALS) tablet Take 1 tablet by mouth daily.    [provider]  PARoxetine (PAXIL) 30 MG tablet Take by mouth. 02/16/21 02/16/22  [provider]  predniSONE (STERAPRED  UNI-PAK 21 TAB) 10 MG (21) TBPK tablet Take by mouth daily. Take 6 tabs by mouth daily  for 2 days, then 5 tabs for 2 days, then 4 tabs for 2 days, then 3 tabs for 2 days, 2 tabs for 2 days, then 1 tab by mouth daily for 2 days 03/27/21   Marney Setting, NP  norethindrone-ethinyl estradiol-iron (ESTROSTEP FE,TILIA FE,TRI-LEGEST FE) 1-20/1-30/1-35 MG-MCG tablet Take 1 tablet by mouth daily.  10/28/19  [provider]    Family History Family History  Problem Relation Age of Onset   Depression Mother    Miscarriages / [place] Mother    Cancer Maternal Grandmother    Cancer Paternal Grandmother    Other Father        unknown medical history    Social History Social History   Tobacco Use   Smoking status: Former    Types: Cigarettes    Quit date: 01/23/2016    Years since quitting: 6.4   Smokeless tobacco: Never  Vaping Use   Vaping Use: Former   Quit date: 01/23/2016  Substance Use Topics   Alcohol use: No   Drug use: No     Allergies   Patient has no known allergies.   Review of Systems Review  of Systems  Constitutional:  Positive for fatigue. Negative for chills, diaphoresis and fever.  HENT:  Positive for congestion, rhinorrhea and sore throat. Negative for ear pain, sinus pressure and sinus pain.   Respiratory:  Positive for cough. Negative for shortness of breath.   Cardiovascular:  Negative for chest pain.  Gastrointestinal:  Negative for abdominal pain, nausea and vomiting.  Musculoskeletal:  Positive for myalgias.  Skin:  Negative for rash.  Neurological:  Positive for headaches. Negative for weakness.  Hematological:  Negative for adenopathy.     Physical Exam Triage Vital Signs ED Triage Vitals  Enc Vitals Group     BP      Pulse      Resp      Temp      Temp src      SpO2      Weight      Height      Head Circumference      Peak Flow      Pain Score      Pain Loc      Pain Edu?      Excl. in [place]?    No data found.  Updated  Vital Signs BP 112/75   Pulse 83   Temp 98.2 F (36.8 C)   Resp 18   SpO2 98%    Physical Exam Vitals and nursing note reviewed.  Constitutional:      General: She is not in acute distress.    Appearance: Normal appearance. She is not ill-appearing or toxic-appearing.  HENT:     Head: Normocephalic and atraumatic.     Nose: Congestion present.     Mouth/Throat:     Mouth: Mucous membranes are moist.     Pharynx: Oropharynx is clear. Posterior oropharyngeal erythema present.  Eyes:     General: No scleral icterus.       Right eye: No discharge.        Left eye: No discharge.     Conjunctiva/sclera: Conjunctivae normal.  Cardiovascular:     Rate and Rhythm: Normal rate and regular rhythm.     Heart sounds: Normal heart sounds.  Pulmonary:     Effort: Pulmonary effort is normal. No respiratory distress.     Breath sounds: Normal breath sounds.  Musculoskeletal:     Cervical back: Neck supple.  Skin:    General: Skin is dry.  Neurological:     General: No focal deficit present.     Mental Status: She is alert. Mental status is at baseline.     Motor: No weakness.     Gait: Gait normal.  Psychiatric:        Mood and Affect: Mood normal.        Behavior: Behavior normal.        Thought Content: Thought content normal.      UC Treatments / Results  Labs (all labs ordered are listed, but only abnormal results are displayed) Labs Reviewed  GROUP A STREP BY PCR  SARS CORONAVIRUS 2 BY RT PCR    EKG   Radiology No results found.  Procedures Procedures (including critical care time)  Medications Ordered in UC Medications - No data to display  Initial Impression / Assessment and Plan / UC Course  I have reviewed the triage vital signs and the nursing notes.  Pertinent labs & imaging results that were available during my care of the patient were reviewed by me and considered in my medical decision making (see  chart for details).   32 year old female presents  for 3-day history of fatigue, body aches, headache, cough, congestion, and sore throat.  Strep and COVID testing obtained.  Strep test negative.  COVID-negative.  Viral URI.  Supportive care encouraged.  Sent Promethazine DM to pharmacy and viscous lidocaine.  Reviewed return to ER precautions.  Work note given.   Final Clinical Impressions(s) / UC Diagnoses   Final diagnoses:  Viral upper respiratory tract infection  Acute cough  Sore throat     Discharge Instructions      URI/COLD SYMPTOMS: Your exam today is consistent with a viral illness. Antibiotics are not indicated at this time. Use medications as directed, including cough syrup, nasal saline, and decongestants. Your symptoms should improve over the next few days and resolve within 7-10 days. Increase rest and fluids. F/u if symptoms worsen or predominate such as sore throat, ear pain, productive cough, shortness of breath, or if you develop high fevers or worsening fatigue over the next several days.       ED Prescriptions     Medication Sig Dispense Auth. Provider   promethazine-dextromethorphan (PROMETHAZINE-DM) 6.25-15 MG/5ML syrup Take 5 mLs by mouth 4 (four) times daily as needed. 118 mL Laurene Footman B, PA-C   lidocaine (XYLOCAINE) 2 % solution Use as directed 15 mLs in the mouth or throat every 3 (three) hours as needed for mouth pain (swish and spit). 100 mL Danton Clap, PA-C      PDMP not reviewed this encounter.   Danton Clap, PA-C 07/12/22 1029

## 2022-07-12 NOTE — ED Triage Notes (Addendum)
Headache, Sore throat, body aches started a few days ago

## 2023-09-07 ENCOUNTER — Ambulatory Visit: Admission: EM | Admit: 2023-09-07 | Discharge: 2023-09-07 | Disposition: A | Discharge status: Home / Self Care

## 2023-09-07 ENCOUNTER — Telehealth: Payer: Self-pay

## 2023-09-07 DIAGNOSIS — M5442 Lumbago with sciatica, left side: Secondary | ICD-10-CM

## 2023-09-07 MED ORDER — IBUPROFEN 600 MG PO TABS: 600.0000 mg | ORAL_TABLET | Freq: Four times a day (QID) | ORAL | 0 refills | Status: DC | PRN

## 2023-09-07 MED ORDER — IBUPROFEN 600 MG PO TABS: 600.0000 mg | ORAL_TABLET | Freq: Four times a day (QID) | ORAL | 0 refills | Status: AC | PRN

## 2023-09-07 MED ORDER — BACLOFEN 10 MG PO TABS: 10.0000 mg | ORAL_TABLET | Freq: Three times a day (TID) | ORAL | 0 refills | Status: AC

## 2023-09-07 MED ORDER — BACLOFEN 10 MG PO TABS: 10.0000 mg | ORAL_TABLET | Freq: Three times a day (TID) | ORAL | 0 refills | Status: DC

## 2023-09-07 NOTE — ED Provider Notes (Signed)
 [facility]    CSN: [id number] Arrival date & time: 09/07/23  0815      History   Chief Complaint Chief Complaint  Patient presents with   Back Pain    HPI ADDISEN Erickson is a 33 y.o. female.   HPI  33 year old female with past medical history significant for GERD presents for evaluation of low back pain that has been going on for last 4 days.  She reports that she woke up with the pain and she has been using Advil  without improvement of her symptoms.  She reports that the pain is only with change of position and ambulation.  The pain does radiate down her left leg.  Past Medical History:  Diagnosis Date   GERD (gastroesophageal reflux disease)     Patient Active Problem List   Diagnosis Date Noted   Indication for care in labor or delivery 07/02/2016   Vaginal bleeding 03/08/2016   Vaginal bleeding in pregnancy, second trimester 03/08/2016   First trimester screening 12/29/2015    Past Surgical History:  Procedure Laterality Date   NO PAST SURGERIES      OB History     Gravida  1   Para  1   Term  1   Preterm  0   AB  0   Living  1      SAB  0   IAB  0   Ectopic  0   Multiple  0   Live Births  1            Home Medications    Prior to Admission medications   Medication Sig Start Date End Date Taking? Authorizing Provider  baclofen  (LIORESAL ) 10 MG tablet Take 1 tablet (10 mg total) by mouth 3 (three) times daily. 09/07/23  Yes Kent Pear, NP  ibuprofen  (ADVIL ) 600 MG tablet Take 1 tablet (600 mg total) by mouth every 6 (six) hours as needed. 09/07/23  Yes Kent Pear, NP  Multiple Vitamin (MULTI-VITAMIN) tablet Take 1 tablet by mouth daily.    [provider]  Multiple Vitamins-Minerals (MULTIVITAMIN WITH MINERALS) tablet Take 1 tablet by mouth daily.    [provider]  PARoxetine (PAXIL) 30 MG tablet Take by mouth. 02/16/21 02/16/22  [provider]  norethindrone-ethinyl estradiol-iron  (ESTROSTEP FE,TILIA FE,TRI-LEGEST FE) 1-20/1-30/1-35 MG-MCG tablet Take 1 tablet by mouth daily.  10/28/19  [provider]    Family History Family History  Problem Relation Age of Onset   Depression Mother    Miscarriages / [place] Mother    Cancer Maternal Grandmother    Cancer Paternal Grandmother    Other Father        unknown medical history    Social History Social History   Tobacco Use   Smoking status: Former    Current packs/day: 0.00    Types: Cigarettes    Quit date: 01/23/2016    Years since quitting: 7.6   Smokeless tobacco: Never  Vaping Use   Vaping status: Former   Quit date: 01/23/2016  Substance Use Topics   Alcohol use: No   Drug use: No     Allergies   Patient has no known allergies.   Review of Systems Review of Systems  Musculoskeletal:  Positive for back pain.  Neurological:  Negative for weakness and numbness.     Physical Exam Triage Vital Signs ED Triage Vitals  Encounter Vitals Group     BP 09/07/23 0829 107/69  Systolic BP Percentile --      Diastolic BP Percentile --      Pulse Rate 09/07/23 0829 76     Resp 09/07/23 0829 16     Temp 09/07/23 0829 98.6 F (37 C)     Temp Source 09/07/23 0829 Oral     SpO2 09/07/23 0829 98 %     Weight --      Height --      Head Circumference --      Peak Flow --      Pain Score 09/07/23 0828 10     Pain Loc --      Pain Education --      Exclude from Growth Chart --    No data found.  Updated Vital Signs BP 107/69 (BP Location: Right Arm)   Pulse 76   Temp 98.6 F (37 C) (Oral)   Resp 16   LMP 08/25/2023 (Approximate)   SpO2 98%   Visual Acuity Right Eye Distance:   Left Eye Distance:   Bilateral Distance:    Right Eye Near:   Left Eye Near:    Bilateral Near:     Physical Exam Vitals and nursing note reviewed.  Constitutional:      Appearance: Normal appearance. She is not ill-appearing.  Musculoskeletal:        General: No tenderness.  Skin:     General: Skin is warm and dry.     Capillary Refill: Capillary refill takes less than 2 seconds.  Neurological:     General: No focal deficit present.     Mental Status: She is alert and oriented to person, place, and time.      UC Treatments / Results  Labs (all labs ordered are listed, but only abnormal results are displayed) Labs Reviewed - No data to display  EKG   Radiology No results found.  Procedures Procedures (including critical care time)  Medications Ordered in UC Medications - No data to display  Initial Impression / Assessment and Plan / UC Course  I have reviewed the triage vital signs and the nursing notes.  Pertinent labs & imaging results that were available during my care of the patient were reviewed by me and considered in my medical decision making (see chart for details).   Patient is a nontoxic-appearing 33 year old female presenting for evaluation of low back pain that occurred when she woke up 4 days ago.  She does not remember any precipitating event, though she does work at [employer] and does heavy lifting every day.  The pain will radiate down her left leg.  The pain is only present with change in position and ambulation.  In the exam room she has no spinous process tenderness or step-off and I cannot reproduce the pain with palpation of the bilateral lumbar paraspinous region, palpating along the path of the static nerve through the left buttock, and patient has negative straight leg raise bilaterally.  However, I will treat the patient for low back pain with 600 mg ibuprofen  every 6 hours along with 10 mg of baclofen  every 8 hours.  Moist heat and home physical therapy exercises prescribed.  Return precautions reviewed and work note provided.   Final Clinical Impressions(s) / UC Diagnoses   Final diagnoses:  Acute bilateral low back pain with left-sided sciatica     Discharge Instructions      Take the ibuprofen , 600 mg every 6 hours with food,  on a schedule for the next 48 hours  and then as needed.  Take the baclofen , 10 mg every 8 hours, on a schedule for the next 48 hours and then as needed.  Apply moist heat to your back for 30 minutes at a time 2-3 times a day to improve blood flow to the area and help remove the lactic acid causing the spasm.  Follow the back exercises given at discharge.  Return for reevaluation for any new or worsening symptoms.      ED Prescriptions     Medication Sig Dispense Auth. Provider   ibuprofen  (ADVIL ) 600 MG tablet Take 1 tablet (600 mg total) by mouth every 6 (six) hours as needed. 30 tablet Kent Pear, NP   baclofen  (LIORESAL ) 10 MG tablet Take 1 tablet (10 mg total) by mouth 3 (three) times daily. 30 each Kent Pear, NP      PDMP not reviewed this encounter.   Kent Pear, NP 09/07/23 [phone]

## 2023-09-07 NOTE — Discharge Instructions (Addendum)

## 2023-09-07 NOTE — ED Triage Notes (Signed)
 Patient presents to UC for lower back pain since 4 days. Woke up with pain, treating pain with advil , OTC leg/back pain.
